# Patient Record
Sex: Male | Born: 1945 | Race: White | Hispanic: No | Marital: Married | Smoking: Never smoker
Health system: Southern US, Community
[De-identification: ages and names within clinical notes are randomized; demographics above are authoritative.]

## PROBLEM LIST (undated history)

## (undated) DIAGNOSIS — E119 Type 2 diabetes mellitus without complications: Secondary | ICD-10-CM

## (undated) DIAGNOSIS — N400 Enlarged prostate without lower urinary tract symptoms: Secondary | ICD-10-CM

## (undated) DIAGNOSIS — G4733 Obstructive sleep apnea (adult) (pediatric): Secondary | ICD-10-CM

## (undated) DIAGNOSIS — Z95810 Presence of automatic (implantable) cardiac defibrillator: Secondary | ICD-10-CM

## (undated) DIAGNOSIS — I5022 Chronic systolic (congestive) heart failure: Secondary | ICD-10-CM

## (undated) DIAGNOSIS — E669 Obesity, unspecified: Secondary | ICD-10-CM

## (undated) DIAGNOSIS — I10 Essential (primary) hypertension: Secondary | ICD-10-CM

## (undated) DIAGNOSIS — I2510 Atherosclerotic heart disease of native coronary artery without angina pectoris: Secondary | ICD-10-CM

## (undated) HISTORY — PX: PACEMAKER IMPLANT: EP1218

## (undated) HISTORY — PX: BYPASS GRAFT ANGIOGRAPHY: CATH118229

## (undated) HISTORY — PX: CHOLECYSTECTOMY: SHX55

## (undated) NOTE — Anesthesia Procedure Notes (Signed)
 Associated Order(s): Airway Formatting of this note might be different from the original. Airway Patient Location: OR Urgency: Elective Indications: Anesthesia Procedure Start Time: 02/19/2024 7:41 AM Performing Provider: Marinda Leonor Mace, CRNA Authorizing Provider: Alvis Charlie Pointer, MD  AIRWAY DETAILS  MILS Maintained?: MILS maintained throughout Airway type: ETT Mask Ventilation: Easy  Endotracheal Tube ETT Type: Oral Cuffed?: yes Placement technique: Direct laryngoscopy Facilitating devices/methods: Stylet Insertion site: Oral Blade Type: Miller Blade size: #2 ETT size: 7.5 mm  Placement verified by: auscultation and CO2 detection Cormack - Lehane Classification: Grade IIa - partial view of glottis Taped @: 22 cm Number of attempts: 1 Difficult Airway: airway not difficult Aspiration? no aspiration Lips and Teeth: intact Eyes: Taped  Additional Comments: Lidocaine  4% (LTA) 120mg     Electronically signed by Marinda Leonor Mace, CRNA at 02/19/2024  8:01 AM CDT

## (undated) NOTE — Anesthesia Preprocedure Evaluation (Signed)
 Formatting of this note is different from the original. Images from the original note were not included. Pre-Anesthesia Evaluation  Patient Height:   Patient Weight:   Surgeon: Kayla Massie Pierre, MD PONV Risk Factors: PONV: Low Risk  Total Score: 1   Score Rules    Non-smoker   Criteria that do not apply:   Male patient   History of PONV   History of motion sickness   Intended opioid administration    Allergies: Allergies[1] Number of Hours NPO: 11.3  Anesthesia History  No specialty history recorded   Other Medical History  Hypertension Hyperlipidemia  Myocardial infarction (HC Category) History of stroke  Atrial fibrillation (HC Category)    Surgical History  CORONARY ARTERY BYPASS GRAFT CARDIOVERSION  TOTAL HIP ARTHROPLASTY CHOLECYSTECTOMY  CARDIAC PACEMAKER PLACEMENT CARDIAC DEFIBRILLATOR PLACEMENT   Substance History  Smoking Status: Never  Smokeless Tobacco Status: Never  Alcohol use: Never  Drug use: Never   Anesthesia Family History  Problem Relations (Age of Onset)  No history of this type found     Relevant Problems  No relevant active problems   Anesthetic Review of Systems/Med History  Patient summary reviewed, Nursing notes reviewed - History of anesthetic complications  Pulmonary:  Other Pulmonary findings:  Tests reviewed: ECG and all labs  Beta Blockers:   Neuro/Psych:    + CVA -            Other Neuro/Psych Findings:  Cardiovascular:   + pacemaker, + hypercholesterolemia, + obesity: Obesity (BMI>30), + hypertension, + dysrhythmia: atrial fibrillation,   Other Cardiac Findings:   Endocrine/Other:         + GLP-1 agonist  Endo/Other Findings: Last [facility] Wednesday 02/14/2024 GI/Hepatic/Renal:  Other GI/Hepatic/Renal Findings:   OBGYN:   Other OBGYN Findings:  Pediatric:   Other Pediatric Findings:    Anesthesia Physical Exam: Airway: Mallampati Score: II   Cardiovascular:     Paced  Abdominal: + obesity  Pulmonary: pulmonary exam normal    Dental:  Caps and crowns.  Last Liquid Intake: >=2 hrs Last Solid Intake: >=8 hrs  Other Anesthesia Findings/Comments:   Anesthesia Plan  ASA Score: 3  Anesthetic plan:  general Regional block contraindicated:  Regional block contraindications:  Sedation type:  Additional comments:  Planned airway type: ETT Induction method: intravenous Anesthesia plan, risks and options discussed with: patient Use of blood products discussed with:  Patient:  Blood refused reason (if any):      [1] No Known Allergies  Electronically signed by Alvis Charlie Pointer, MD at 02/19/2024  6:28 AM CDT

## (undated) NOTE — Anesthesia Postprocedure Evaluation (Signed)
 Formatting of this note might be different from the original. Post-Operative Anesthesia  Patient: Hoby Kawai Anesthesia Type: general  BP: 129/73  SpO2: 96 %  Heart Rate: 60  Resp: 19  Temp: 37 C (98.6 F)  This assessment was performed by me at the conclusion of anesthesia care in the PACU.. The patient was evaluated and found to have returned to baseline cardiopulmonary and mental status.  Anesthesia complications encountered?: no anesthesia complication  Where was patient evaluated?: PACU  Post anesthesia evaluation reveals the following: Cardiovascular Function: acceptable and stable Hydration Status: acceptable Nausea/Vomiting Management: none Airway Patency: patent   Respiratory Status: acceptable Level of Consciousness: sleepy but conscious Patient participation in evaluation: complete - patient participated Pain Control Pain Score (1-10 scale): 0 Pain Management: adequate     Continued or Long-Acting Analgesia Issues: None required  Nathaniel J. Marinda, CRNA 02/19/2024 Electronically signed by Marinda Leonor Mace, CRNA at 02/19/2024  9:27 AM CDT

---

## 2017-08-31 DIAGNOSIS — G4733 Obstructive sleep apnea (adult) (pediatric): Secondary | ICD-10-CM | POA: Diagnosis present

## 2020-10-21 ENCOUNTER — Other Ambulatory Visit: Payer: Self-pay

## 2020-10-21 ENCOUNTER — Encounter (HOSPITAL_COMMUNITY): Payer: Self-pay

## 2020-10-21 ENCOUNTER — Emergency Department (HOSPITAL_COMMUNITY): Payer: Medicare Other

## 2020-10-21 ENCOUNTER — Inpatient Hospital Stay (HOSPITAL_COMMUNITY)
Admission: EM | Admit: 2020-10-21 | Discharge: 2020-10-27 | DRG: 291 | Disposition: A | Discharge status: Home / Self Care | Payer: Medicare Other | Attending: Internal Medicine | Admitting: Internal Medicine

## 2020-10-21 ENCOUNTER — Ambulatory Visit (HOSPITAL_COMMUNITY)
Admission: EM | Admit: 2020-10-21 | Discharge: 2020-10-21 | Disposition: A | Discharge status: Home / Self Care | Payer: Medicare Other

## 2020-10-21 DIAGNOSIS — Z79899 Other long term (current) drug therapy: Secondary | ICD-10-CM

## 2020-10-21 DIAGNOSIS — I420 Dilated cardiomyopathy: Secondary | ICD-10-CM | POA: Diagnosis present

## 2020-10-21 DIAGNOSIS — I2510 Atherosclerotic heart disease of native coronary artery without angina pectoris: Secondary | ICD-10-CM | POA: Diagnosis present

## 2020-10-21 DIAGNOSIS — E876 Hypokalemia: Secondary | ICD-10-CM | POA: Diagnosis present

## 2020-10-21 DIAGNOSIS — Z951 Presence of aortocoronary bypass graft: Secondary | ICD-10-CM

## 2020-10-21 DIAGNOSIS — I252 Old myocardial infarction: Secondary | ICD-10-CM

## 2020-10-21 DIAGNOSIS — Z7902 Long term (current) use of antithrombotics/antiplatelets: Secondary | ICD-10-CM

## 2020-10-21 DIAGNOSIS — I509 Heart failure, unspecified: Secondary | ICD-10-CM

## 2020-10-21 DIAGNOSIS — I5082 Biventricular heart failure: Secondary | ICD-10-CM | POA: Diagnosis present

## 2020-10-21 DIAGNOSIS — Z6834 Body mass index (BMI) 34.0-34.9, adult: Secondary | ICD-10-CM

## 2020-10-21 DIAGNOSIS — R609 Edema, unspecified: Secondary | ICD-10-CM | POA: Diagnosis not present

## 2020-10-21 DIAGNOSIS — Z8249 Family history of ischemic heart disease and other diseases of the circulatory system: Secondary | ICD-10-CM

## 2020-10-21 DIAGNOSIS — I270 Primary pulmonary hypertension: Secondary | ICD-10-CM | POA: Insufficient documentation

## 2020-10-21 DIAGNOSIS — Z9049 Acquired absence of other specified parts of digestive tract: Secondary | ICD-10-CM

## 2020-10-21 DIAGNOSIS — E785 Hyperlipidemia, unspecified: Secondary | ICD-10-CM | POA: Diagnosis present

## 2020-10-21 DIAGNOSIS — I255 Ischemic cardiomyopathy: Secondary | ICD-10-CM | POA: Diagnosis present

## 2020-10-21 DIAGNOSIS — R079 Chest pain, unspecified: Secondary | ICD-10-CM | POA: Diagnosis present

## 2020-10-21 DIAGNOSIS — Z7982 Long term (current) use of aspirin: Secondary | ICD-10-CM

## 2020-10-21 DIAGNOSIS — I110 Hypertensive heart disease with heart failure: Principal | ICD-10-CM | POA: Diagnosis present

## 2020-10-21 DIAGNOSIS — N400 Enlarged prostate without lower urinary tract symptoms: Secondary | ICD-10-CM | POA: Diagnosis present

## 2020-10-21 DIAGNOSIS — E119 Type 2 diabetes mellitus without complications: Secondary | ICD-10-CM | POA: Diagnosis present

## 2020-10-21 DIAGNOSIS — I5023 Acute on chronic systolic (congestive) heart failure: Secondary | ICD-10-CM | POA: Diagnosis not present

## 2020-10-21 DIAGNOSIS — I4892 Unspecified atrial flutter: Secondary | ICD-10-CM | POA: Diagnosis present

## 2020-10-21 DIAGNOSIS — Z20822 Contact with and (suspected) exposure to covid-19: Secondary | ICD-10-CM | POA: Diagnosis present

## 2020-10-21 DIAGNOSIS — Z28310 Unvaccinated for covid-19: Secondary | ICD-10-CM

## 2020-10-21 DIAGNOSIS — G4733 Obstructive sleep apnea (adult) (pediatric): Secondary | ICD-10-CM | POA: Diagnosis present

## 2020-10-21 DIAGNOSIS — Z955 Presence of coronary angioplasty implant and graft: Secondary | ICD-10-CM

## 2020-10-21 DIAGNOSIS — Z9111 Patient's noncompliance with dietary regimen: Secondary | ICD-10-CM

## 2020-10-21 DIAGNOSIS — E669 Obesity, unspecified: Secondary | ICD-10-CM | POA: Diagnosis present

## 2020-10-21 DIAGNOSIS — Z7984 Long term (current) use of oral hypoglycemic drugs: Secondary | ICD-10-CM

## 2020-10-21 DIAGNOSIS — I10 Essential (primary) hypertension: Secondary | ICD-10-CM | POA: Diagnosis present

## 2020-10-21 DIAGNOSIS — R110 Nausea: Secondary | ICD-10-CM

## 2020-10-21 DIAGNOSIS — Z95810 Presence of automatic (implantable) cardiac defibrillator: Secondary | ICD-10-CM

## 2020-10-21 HISTORY — DX: Essential (primary) hypertension: I10

## 2020-10-21 HISTORY — DX: Obesity, unspecified: E66.9

## 2020-10-21 HISTORY — DX: Benign prostatic hyperplasia without lower urinary tract symptoms: N40.0

## 2020-10-21 HISTORY — DX: Chronic systolic (congestive) heart failure: I50.22

## 2020-10-21 HISTORY — DX: Atherosclerotic heart disease of native coronary artery without angina pectoris: I25.10

## 2020-10-21 HISTORY — DX: Type 2 diabetes mellitus without complications: E11.9

## 2020-10-21 HISTORY — DX: Presence of automatic (implantable) cardiac defibrillator: Z95.810

## 2020-10-21 HISTORY — DX: Obstructive sleep apnea (adult) (pediatric): G47.33

## 2020-10-21 LAB — URINALYSIS, ROUTINE W REFLEX MICROSCOPIC
Bilirubin Urine: NEGATIVE
Glucose, UA: NEGATIVE mg/dL
Hgb urine dipstick: NEGATIVE
Ketones, ur: NEGATIVE mg/dL
Leukocytes,Ua: NEGATIVE
Nitrite: NEGATIVE
Protein, ur: NEGATIVE mg/dL
Specific Gravity, Urine: 1.006 (ref 1.005–1.030)
pH: 7 (ref 5.0–8.0)

## 2020-10-21 LAB — BRAIN NATRIURETIC PEPTIDE: B Natriuretic Peptide: 267.2 pg/mL — ABNORMAL HIGH (ref 0.0–100.0)

## 2020-10-21 LAB — LIPASE, BLOOD: Lipase: 45 U/L (ref 11–51)

## 2020-10-21 LAB — HEPATIC FUNCTION PANEL
ALT: 29 U/L (ref 0–44)
AST: 27 U/L (ref 15–41)
Albumin: 4 g/dL (ref 3.5–5.0)
Alkaline Phosphatase: 120 U/L (ref 38–126)
Bilirubin, Direct: 0.3 mg/dL — ABNORMAL HIGH (ref 0.0–0.2)
Indirect Bilirubin: 0.8 mg/dL (ref 0.3–0.9)
Total Bilirubin: 1.1 mg/dL (ref 0.3–1.2)
Total Protein: 7.2 g/dL (ref 6.5–8.1)

## 2020-10-21 LAB — BASIC METABOLIC PANEL
Anion gap: 10 (ref 5–15)
BUN: 18 mg/dL (ref 8–23)
CO2: 27 mmol/L (ref 22–32)
Calcium: 9.6 mg/dL (ref 8.9–10.3)
Chloride: 102 mmol/L (ref 98–111)
Creatinine, Ser: 0.91 mg/dL (ref 0.61–1.24)
GFR, Estimated: >60 mL/min (ref >60)
Glucose, Bld: 111 mg/dL — ABNORMAL HIGH (ref 70–99)
Potassium: 3.6 mmol/L (ref 3.5–5.1)
Sodium: 139 mmol/L (ref 135–145)

## 2020-10-21 LAB — CBC
HCT: 42.9 % (ref 39.0–52.0)
Hemoglobin: 13.3 g/dL (ref 13.0–17.0)
MCH: 27.2 pg (ref 26.0–34.0)
MCHC: 31 g/dL (ref 30.0–36.0)
MCV: 87.7 fL (ref 80.0–100.0)
Platelets: 149 10*3/uL — ABNORMAL LOW (ref 150–400)
RBC: 4.89 MIL/uL (ref 4.22–5.81)
RDW: 17.2 % — ABNORMAL HIGH (ref 11.5–15.5)
WBC: 5.3 10*3/uL (ref 4.0–10.5)
nRBC: 0 % (ref 0.0–0.2)

## 2020-10-21 LAB — TROPONIN I (HIGH SENSITIVITY)
Troponin I (High Sensitivity): 15 ng/L (ref <18)
Troponin I (High Sensitivity): 16 ng/L (ref <18)

## 2020-10-21 LAB — GLUCOSE, CAPILLARY: Glucose-Capillary: 121 mg/dL — ABNORMAL HIGH (ref 70–99)

## 2020-10-21 LAB — RESP PANEL BY RT-PCR (FLU A&B, COVID) ARPGX2
Influenza A by PCR: NEGATIVE
Influenza B by PCR: NEGATIVE
SARS Coronavirus 2 by RT PCR: NEGATIVE

## 2020-10-21 MED ORDER — LISINOPRIL 10 MG PO TABS
10.0000 mg | ORAL_TABLET | Freq: Every day | ORAL | Status: DC
  Administered 2020-10-22: 10 mg via ORAL
  Filled 2020-10-21: qty 1

## 2020-10-21 MED ORDER — FUROSEMIDE 40 MG PO TABS
40.0000 mg | ORAL_TABLET | Freq: Every day | ORAL | Status: DC
  Administered 2020-10-22: 40 mg via ORAL
  Filled 2020-10-21: qty 1

## 2020-10-21 MED ORDER — ASPIRIN EC 81 MG PO TBEC
81.0000 mg | DELAYED_RELEASE_TABLET | Freq: Every day | ORAL | Status: DC
  Administered 2020-10-22 – 2020-10-25 (×4): 81 mg via ORAL
  Filled 2020-10-21 (×4): qty 1

## 2020-10-21 MED ORDER — CARVEDILOL 25 MG PO TABS
25.0000 mg | ORAL_TABLET | Freq: Two times a day (BID) | ORAL | Status: DC
  Administered 2020-10-21 – 2020-10-27 (×12): 25 mg via ORAL
  Filled 2020-10-21 (×12): qty 1

## 2020-10-21 MED ORDER — SODIUM CHLORIDE 0.9% FLUSH
3.0000 mL | Freq: Two times a day (BID) | INTRAVENOUS | Status: DC
  Administered 2020-10-21 – 2020-10-27 (×12): 3 mL via INTRAVENOUS

## 2020-10-21 MED ORDER — FINASTERIDE 5 MG PO TABS: 5.0000 mg | ORAL_TABLET | Freq: Every day | ORAL | Status: DC

## 2020-10-21 MED ORDER — CLOPIDOGREL BISULFATE 75 MG PO TABS
75.0000 mg | ORAL_TABLET | Freq: Every day | ORAL | Status: DC
  Administered 2020-10-22 – 2020-10-27 (×6): 75 mg via ORAL
  Filled 2020-10-21 (×6): qty 1

## 2020-10-21 MED ORDER — ACETAMINOPHEN 650 MG RE SUPP
650.0000 mg | Freq: Four times a day (QID) | RECTAL | Status: DC | PRN
Start: 2020-10-21 — End: 2020-10-27

## 2020-10-21 MED ORDER — TAMSULOSIN HCL 0.4 MG PO CAPS
0.4000 mg | ORAL_CAPSULE | Freq: Every day | ORAL | Status: DC
  Administered 2020-10-22 – 2020-10-27 (×6): 0.4 mg via ORAL
  Filled 2020-10-21 (×6): qty 1

## 2020-10-21 MED ORDER — ENOXAPARIN SODIUM 40 MG/0.4ML IJ SOSY
40.0000 mg | PREFILLED_SYRINGE | Freq: Every day | INTRAMUSCULAR | Status: DC
  Administered 2020-10-22 – 2020-10-25 (×2): 40 mg via SUBCUTANEOUS
  Filled 2020-10-21 (×4): qty 0.4

## 2020-10-21 MED ORDER — FUROSEMIDE 10 MG/ML IJ SOLN
20.0000 mg | Freq: Once | INTRAMUSCULAR | Status: AC
  Administered 2020-10-21: 20 mg via INTRAVENOUS
  Filled 2020-10-21: qty 2

## 2020-10-21 MED ORDER — POLYETHYLENE GLYCOL 3350 17 G PO PACK
17.0000 g | PACK | Freq: Every day | ORAL | Status: DC | PRN
  Administered 2020-10-25 – 2020-10-26 (×2): 17 g via ORAL
  Filled 2020-10-21 (×2): qty 1

## 2020-10-21 MED ORDER — FINASTERIDE 5 MG PO TABS
5.0000 mg | ORAL_TABLET | Freq: Every day | ORAL | Status: DC
  Administered 2020-10-21 – 2020-10-26 (×6): 5 mg via ORAL
  Filled 2020-10-21 (×6): qty 1

## 2020-10-21 MED ORDER — INSULIN ASPART 100 UNIT/ML IJ SOLN
0.0000 [IU] | Freq: Three times a day (TID) | INTRAMUSCULAR | Status: DC
  Administered 2020-10-22 – 2020-10-23 (×2): 3 [IU] via SUBCUTANEOUS
  Administered 2020-10-23 – 2020-10-24 (×3): 2 [IU] via SUBCUTANEOUS

## 2020-10-21 MED ORDER — ACETAMINOPHEN 325 MG PO TABS: 650.0000 mg | ORAL_TABLET | Freq: Four times a day (QID) | ORAL | Status: DC | PRN

## 2020-10-21 MED ORDER — CARVEDILOL 12.5 MG PO TABS: 25.0000 mg | ORAL_TABLET | Freq: Two times a day (BID) | ORAL | Status: DC

## 2020-10-21 MED ORDER — ROSUVASTATIN CALCIUM 20 MG PO TABS: 40.0000 mg | ORAL_TABLET | Freq: Every day | ORAL | Status: DC

## 2020-10-21 MED ORDER — FUROSEMIDE 20 MG PO TABS
20.0000 mg | ORAL_TABLET | Freq: Every day | ORAL | Status: DC
Start: 2020-10-22 — End: 2020-10-21

## 2020-10-21 MED ORDER — NITROGLYCERIN 0.4 MG SL SUBL: 0.4000 mg | SUBLINGUAL_TABLET | SUBLINGUAL | Status: DC | PRN

## 2020-10-21 MED ORDER — ROSUVASTATIN CALCIUM 20 MG PO TABS
40.0000 mg | ORAL_TABLET | Freq: Every day | ORAL | Status: DC
  Administered 2020-10-21 – 2020-10-26 (×6): 40 mg via ORAL
  Filled 2020-10-21 (×7): qty 2

## 2020-10-21 NOTE — ED Provider Notes (Signed)
[facility]    CSN: [id number] Arrival date & time: 10/21/20  1040      History   Chief Complaint Chief Complaint  Patient presents with   Nausea   Leg Swelling    HPI Chad Hahn is a 75 y.o. male.   HPI  Leg Swelling: Patient reports that he had an emergency cholecystectomy about 2 months ago.  He states that after the surgery he was "given too much fluid "and he went into the hospital with heart failure soon after.  He states that progressively since that time he has been feeling worse.  He has had nausea, extension of his belly, peripheral edema, shortness of breath with exertion and yesterday he did have some chest tightness.  He states that he is not tracking his weight.  No active shortness of breath or chest pain.  No fevers.  Past Medical History:  Diagnosis Date   CHF (congestive heart failure) (HCC)    Diabetes mellitus without complication (HCC)    Hypertension    Pacemaker     There are no problems to display for this patient.   Past Surgical History:  Procedure Laterality Date   BYPASS GRAFT ANGIOGRAPHY     CHOLECYSTECTOMY     PACEMAKER IMPLANT         Home Medications    Prior to Admission medications   Medication Sig Start Date End Date Taking? Authorizing Provider  tamsulosin (FLOMAX) 0.4 MG CAPS capsule Take 0.4 mg by mouth.   Yes [provider]  aspirin 81 MG EC tablet Adult Low Dose Aspirin 81 mg tablet,delayed release  Take 1 tablet every day by oral route.    [provider]  carvedilol (COREG) 25 MG tablet carvedilol 25 mg tablet    [provider]  clopidogrel (PLAVIX) 75 MG tablet clopidogrel 75 mg tablet    [provider]  finasteride (PROSCAR) 5 MG tablet finasteride 5 mg tablet    [provider]  furosemide (LASIX) 20 MG tablet furosemide 20 mg tablet    [provider]  lisinopril (ZESTRIL) 10 MG tablet lisinopril 10 mg tablet    [provider]   metFORMIN (GLUCOPHAGE-XR) 500 MG 24 hr tablet metformin ER 500 mg tablet,extended release 24 hr    [provider]  nitroGLYCERIN (NITROSTAT) 0.4 MG SL tablet nitroglycerin 0.4 mg sublingual tablet    [provider]  rosuvastatin (CRESTOR) 40 MG tablet rosuvastatin 40 mg tablet    [provider]    Family History History reviewed. No pertinent family history.  Social History Social History   Tobacco Use   Smoking status: Never   Smokeless tobacco: Never  Vaping Use   Vaping Use: Never used  Substance Use Topics   Alcohol use: Not Currently   Drug use: Never     Allergies   Patient has no known allergies.   Review of Systems Review of Systems  As stated above in HPI Physical Exam Triage Vital Signs ED Triage Vitals  Enc Vitals Group     BP 10/21/20 1114 131/78     Pulse Rate 10/21/20 1114 66     Resp 10/21/20 1114 20     Temp 10/21/20 1114 98.4 F (36.9 C)     Temp Source 10/21/20 1114 Oral     SpO2 10/21/20 1114 97 %     Weight --      Height --      Head Circumference --  Peak Flow --      Pain Score 10/21/20 1117 0     Pain Loc --      Pain Edu? --      Excl. in GC? --    No data found.  Updated Vital Signs BP 131/78 (BP Location: Right Arm)   Pulse 66   Temp 98.4 F (36.9 C) (Oral)   Resp 20   SpO2 97%   Physical Exam Vitals and nursing note reviewed.  Constitutional:      General: He is not in acute distress.    Appearance: Normal appearance. He is not ill-appearing, toxic-appearing or diaphoretic.  HENT:     Head: Normocephalic and atraumatic.  Cardiovascular:     Rate and Rhythm: Normal rate and regular rhythm.     Heart sounds: Normal heart sounds.  Pulmonary:     Effort: Pulmonary effort is normal.     Breath sounds: Rhonchi and rales present.  Abdominal:     General: There is distension.  Musculoskeletal:     Right lower leg: Edema present.     Left lower leg: Edema present.  Skin:    General:  Skin is warm.     Coloration: Skin is not jaundiced or pale.  Neurological:     Mental Status: He is alert and oriented to person, place, and time.     UC Treatments / Results  Labs (all labs ordered are listed, but only abnormal results are displayed) Labs Reviewed - No data to display  EKG   Radiology No results found.  Procedures Procedures (including critical care time)  Medications Ordered in UC Medications - No data to display  Initial Impression / Assessment and Plan / UC Course  I have reviewed the triage vital signs and the nursing notes.  Pertinent labs & imaging results that were available during my care of the patient were reviewed by me and considered in my medical decision making (see chart for details).     New.  I am concerned that he has acute on chronic heart failure.  I discussed this with patient.  I have recommended that he go to the emergency room for further evaluation.  He is going to have his daughter transport him to the hospital which is his request. Final Clinical Impressions(s) / UC Diagnoses   Final diagnoses:  None   Discharge Instructions   None    ED Prescriptions   None    PDMP not reviewed this encounter.   Rushie Chestnut, [place] 10/21/20 1151

## 2020-10-21 NOTE — Discharge Instructions (Addendum)
Please go directly to the hospital  °

## 2020-10-21 NOTE — ED Provider Notes (Signed)
Emergency Medicine Provider Triage Evaluation Note  Chad Hahn , a 75 y.o. male  was evaluated in triage.  Pt complains of edema, chest pressure, nausea and fatigue. He states that about 2 months ago he had his gallbladder removed in [place].  He states that he had significant complications with that including that it had burst and he was given a lot of fluid.  After that he was readmitted a few days later for edema. He states that he is now here in [place] as his daughter lives here.  His primary medical team is in [place].  He has been taking Lasix.  He states that his swelling is gotten worse over the past 3 weeks..  Review of Systems  Positive: Leg swelling, fatigue  Negative: Fevers, vomiting, diarrhea  Physical Exam  BP 136/81 (BP Location: Right Arm)   Pulse 70   Temp 98 F (36.7 C) (Oral)   Resp 17   SpO2 97%  Gen:   Awake, no distress   Resp:  Normal effort  MSK:   Moves extremities without difficulty  Other:  3+ pitting edema bilateral lower extremities.   Medical Decision Making  Medically screening exam initiated at 12:51 PM.  Appropriate orders placed.  Chad Hahn was informed that the remainder of the evaluation will be completed by another provider, this initial triage assessment does not replace that evaluation, and the importance of remaining in the ED until their evaluation is complete.  Note: Portions of this report may have been transcribed using voice recognition software. Every effort was made to ensure accuracy; however, inadvertent computerized transcription errors may be present    Chad Hahn 10/21/20 1252    Pricilla Loveless, MD 10/24/20 1520

## 2020-10-21 NOTE — H&P (Signed)
History and Physical   Chad LaundryRand Mitro ZOX:[id number] DOB: 06/30/1945 DOA: 10/21/2020  PCP: System, Provider Not In   Patient coming from: Home  Chief Complaint: Chest pain, edema, shortness of breath  HPI: Chad Hahn is a 75 y.o. male with medical history significant of CHF, hypertension, CAD status post CABG, diabetes, BPH, OSA status post pacemaker.  He presents with ongoing intermittent edema, chest pressure, shortness of breath, fatigue. Patient previously lived in [place] and works as an Art gallery managerengineer.  2 months ago he was in [place] for work and ended up having to have his gallbladder removed.  Around that hospitalization he had exacerbation of chronic CHF. Reportedly his EF was in the 30s to 40s however he do not have documentation of this. He has come to [place] to stay with his daughter.  We do not have significant records as above he is from [place].  He states that he has had intermittent edema, chest pressure, shortness of breath worse with exertion for the last few weeks.  He has orthopnea.  He has been taking his Lasix 40 mg daily.  He has had chest pain described as chest pressure, none currently. He denies fevers, chills, abdominal pain, constipation, diarrhea, nausea, vomiting.  Daughter is former cardiac Nurse and has some concerns that he hasn't been himself since hospitalization 2 months ago and wants to make sure his heart is okay.  ED Course: Vital signs in the ED were stable.  Lab work-up showed CMP with glucose 111, CBC with platelets 149.  Troponin normal x2, BNP mildly elevated to 67.  Lipase negative.  Respiratory panel flu and COVID-negative.  Chest x-ray with cardiomegaly but no significant signs of acute CHF.  Urinalysis normal.  Patient was given a dose of IV Lasix in ED.  Review of Systems: As per HPI otherwise all other systems reviewed and are negative.  Past Medical History:  Diagnosis Date   CHF (congestive heart failure) (HCC)    Diabetes mellitus  without complication (HCC)    Hypertension    Pacemaker     Past Surgical History:  Procedure Laterality Date   BYPASS GRAFT ANGIOGRAPHY     CHOLECYSTECTOMY     PACEMAKER IMPLANT      Social History  reports that he has never smoked. He has never used smokeless tobacco. He reports previous alcohol use. He reports that he does not use drugs.  No Known Allergies  No family history on file.  Prior to Admission medications   Medication Sig Start Date End Date Taking? Authorizing Provider  aspirin 81 MG EC tablet Adult Low Dose Aspirin 81 mg tablet,delayed release  Take 1 tablet every day by oral route.    [provider]  carvedilol (COREG) 25 MG tablet carvedilol 25 mg tablet    [provider]  clopidogrel (PLAVIX) 75 MG tablet clopidogrel 75 mg tablet    [provider]  finasteride (PROSCAR) 5 MG tablet finasteride 5 mg tablet    [provider]  furosemide (LASIX) 20 MG tablet furosemide 20 mg tablet    [provider]  lisinopril (ZESTRIL) 10 MG tablet lisinopril 10 mg tablet    [provider]  metFORMIN (GLUCOPHAGE-XR) 500 MG 24 hr tablet metformin ER 500 mg tablet,extended release 24 hr    [provider]  nitroGLYCERIN (NITROSTAT) 0.4 MG SL tablet nitroglycerin 0.4 mg sublingual tablet    [provider]  rosuvastatin (CRESTOR) 40 MG tablet rosuvastatin 40 mg tablet    [provider]  tamsulosin (FLOMAX) 0.4 MG CAPS capsule Take 0.4 mg by mouth.    [provider]    Physical Exam: Vitals:   10/21/20 1700 10/21/20 1759 10/21/20 1830 10/21/20 2000  BP: (!) 146/87 (!) 143/93 (!) 148/84 (!) 145/94  Pulse: 70  70 71  Resp: 16 20 (!) 22 20  Temp:      TempSrc:      SpO2: 98% 100% 100% 100%   Physical Exam Constitutional:      General: He is not in acute distress.    Appearance: Normal appearance.  HENT:     Head: Normocephalic and atraumatic.     Mouth/Throat:     Mouth:  Mucous membranes are moist.     Pharynx: Oropharynx is clear.  Eyes:     Extraocular Movements: Extraocular movements intact.     Pupils: Pupils are equal, round, and reactive to light.  Cardiovascular:     Rate and Rhythm: Normal rate and regular rhythm.     Pulses: Normal pulses.     Heart sounds: Normal heart sounds.  Pulmonary:     Effort: Pulmonary effort is normal. No respiratory distress.     Breath sounds: Normal breath sounds.  Abdominal:     General: Bowel sounds are normal. There is no distension.     Palpations: Abdomen is soft.     Tenderness: There is no abdominal tenderness.  Musculoskeletal:        General: No swelling or deformity.     Right lower leg: Edema present.     Left lower leg: Edema present.  Skin:    General: Skin is warm and dry.  Neurological:     General: No focal deficit present.     Mental Status: Mental status is at baseline.    Labs on Admission: I have personally reviewed following labs and imaging studies  CBC: Recent Labs  Lab 10/21/20 1240  WBC 5.3  HGB 13.3  HCT 42.9  MCV 87.7  PLT 149*    Basic Metabolic Panel: Recent Labs  Lab 10/21/20 1240  NA 139  K 3.6  CL 102  CO2 27  GLUCOSE 111*  BUN 18  CREATININE 0.91  CALCIUM 9.6    GFR: CrCl cannot be calculated (Unknown ideal weight.).  Liver Function Tests: Recent Labs  Lab 10/21/20 1240  AST 27  ALT 29  ALKPHOS 120  BILITOT 1.1  PROT 7.2  ALBUMIN 4.0    Urine analysis:    Component Value Date/Time   COLORURINE STRAW (A) 10/21/2020 1251   APPEARANCEUR CLEAR 10/21/2020 1251   LABSPEC 1.006 10/21/2020 1251   PHURINE 7.0 10/21/2020 1251   GLUCOSEU NEGATIVE 10/21/2020 1251   HGBUR NEGATIVE 10/21/2020 1251   BILIRUBINUR NEGATIVE 10/21/2020 1251   KETONESUR NEGATIVE 10/21/2020 1251   PROTEINUR NEGATIVE 10/21/2020 1251   NITRITE NEGATIVE 10/21/2020 1251   LEUKOCYTESUR NEGATIVE 10/21/2020 1251    Radiological Exams on Admission: DG Chest 2  View  Result Date: 10/21/2020 CLINICAL DATA:  Chest pain EXAM: CHEST - 2 VIEW COMPARISON:  None. FINDINGS: Left chest wall pacer device is noted with leads in the right atrial appendage, coronary sinus and right ventricle. Status post median sternotomy and CABG. Mild cardiac enlargement. No pleural effusion or edema. The visualized skeletal structures are unremarkable. IMPRESSION: No acute cardiopulmonary abnormalities. Cardiac enlargement without signs of CHF. Electronically Signed   By: Signa Kell M.D.   On: 10/21/2020 13:32    EKG: Independently reviewed. Ventricular  paced rhythm at 70 BPM.  Assessment/Plan Active Problems:   Essential hypertension   Ischemic congestive cardiomyopathy (HCC)   Obstructive sleep apnea syndrome   Chest pain, rule out acute myocardial infarction   Acute on chronic systolic CHF (congestive heart failure) (HCC)   Diabetes (HCC)   CAD (coronary artery disease)  Acute on chronic systolic CHF Chest pain/pressure.  Rule out ACS. > Patient presenting with intermittent chest pain/pressure worse with exertion along with some shortness of breath. > Also with some edema.  Troponin flat in ED.  BNP elevated to 267. > Chronic CHF with flair around gallbladder surgery in [place] 2 months ago.  At the time he reported his ED within the 35 to 45% range however we do not have records. > Patient has history of significant CAD with multiple stents and CABG and is new to the area without any providers.  Patient will be observed overnight due to his chest pressure mild CHF exacerbation and high risk history without outpatient providers. - Monitor on telemetry - Check response to dose of IV Lasix in ED, for now we will order 40 mg p.o. Lasix in the morning - Repeat echocardiogram - Obtain repeat troponins and EKG if further chest pain - Patient will likely benefit from cardiology consult in the morning to at least set him up with outpatient providers.  Consult placed to  care management as well  CAD Hypertension > History of CABG and multiple stents, no records available at this time. - Continue home rosuvastatin, Coreg, Plavix, aspirin, lisinopril  OSA - CPAP  Diabetes > On metformin at home - SSI   DVT prophylaxis: Lovenox  Code Status:   Full, states he will discuss further with family.  Family Communication:  Daughter U[dated by phone Disposition Plan:   Patient is from:  Home  Anticipated DC to:  Home  Anticipated DC date:  1-2 days  Anticipated DC barriers: None  Consults called:  None, may benefit from cardiology in the AM Admission status:  Observation, telemetry   Severity of Illness: The appropriate patient status for this patient is OBSERVATION. Observation status is judged to be reasonable and necessary in order to provide the required intensity of service to ensure the patient's safety. The patient's presenting symptoms, physical exam findings, and initial radiographic and laboratory data in the context of their medical condition is felt to place them at decreased risk for further clinical deterioration. Furthermore, it is anticipated that the patient will be medically stable for discharge from the hospital within 2 midnights of admission. The following factors support the patient status of observation.   " The patient's presenting symptoms include chest pressure, shortness of breath, edema. " The physical exam findings include edema. " The initial radiographic and laboratory data are BNP 267, platelets 1.9, troponin normal, lipase normal, respiratory negative for flu and COVID.Marland Kitchen   Synetta Fail MD [facility]  How to contact the [facility] Attending or Consulting provider 7A - 7P or covering provider during after hours 7P -7A, for this patient?   Check the care team in [facility] and look for a) attending/consulting [facility] provider listed and b) the [facility] team listed Log into [email or web address] and use [place]'s universal password to  access. If you do not have the password, please contact the hospital operator. Locate the [facility] provider you are looking for under [facility] and page to a number that you can be directly reached. If you still have difficulty reaching the provider, please page  the [facility] (Director on Call) for the Hospitalists listed on amion for assistance.  10/21/2020, 9:27 PM

## 2020-10-21 NOTE — ED Provider Notes (Signed)
[facility] EMERGENCY DEPARTMENT Provider Note   CSN: [id number] Arrival date & time: 10/21/20  1213     History Chief Complaint  Patient presents with  . Nausea  . Chest Pain    Chad Hahn is a 75 y.o. male.  The history is provided by the patient. No language interpreter was used.  Chest Pain Pain location:  Unable to specify Pain quality: aching   Pain radiates to:  Does not radiate Pain severity:  Mild Onset quality:  Gradual Duration:  1 day Timing:  Constant Progression:  Unable to specify Chronicity:  New Relieved by:  Nothing Worsened by:  Nothing Ineffective treatments:  None tried Risk factors: coronary artery disease and hypertension     Pt reports he has a history of CAD.  Pt reports he has had stents and bypass surgery.  Pt reports 2 months ago he had his gallbladder removed.  Pt states after surgery he had an episode of CHF.  Pt is on home lasix.  Pt reports his legs began swelling 3 days ago and are not going down with home lasix.  Pt reports his last EF was between 35-40.  Pt has no local doctors.  He is in the process of moving here from [place].  Pt reports recent shortness of breath with exertion.  Pt reports chest discomfort and nausea.    Past Medical History:  Diagnosis Date  . CHF (congestive heart failure) (HCC)   . Diabetes mellitus without complication (HCC)   . Hypertension   . Pacemaker     There are no problems to display for this patient.   Past Surgical History:  Procedure Laterality Date  . BYPASS GRAFT ANGIOGRAPHY    . CHOLECYSTECTOMY    . PACEMAKER IMPLANT         No family history on file.  Social History   Tobacco Use  . Smoking status: Never  . Smokeless tobacco: Never  Vaping Use  . Vaping Use: Never used  Substance Use Topics  . Alcohol use: Not Currently  . Drug use: Never    Home Medications Prior to Admission medications   Medication Sig Start Date End Date Taking? Authorizing  Provider  aspirin 81 MG EC tablet Adult Low Dose Aspirin 81 mg tablet,delayed release  Take 1 tablet every day by oral route.    [provider]  carvedilol (COREG) 25 MG tablet carvedilol 25 mg tablet    [provider]  clopidogrel (PLAVIX) 75 MG tablet clopidogrel 75 mg tablet    [provider]  finasteride (PROSCAR) 5 MG tablet finasteride 5 mg tablet    [provider]  furosemide (LASIX) 20 MG tablet furosemide 20 mg tablet    [provider]  lisinopril (ZESTRIL) 10 MG tablet lisinopril 10 mg tablet    [provider]  metFORMIN (GLUCOPHAGE-XR) 500 MG 24 hr tablet metformin ER 500 mg tablet,extended release 24 hr    [provider]  nitroGLYCERIN (NITROSTAT) 0.4 MG SL tablet nitroglycerin 0.4 mg sublingual tablet    [provider]  rosuvastatin (CRESTOR) 40 MG tablet rosuvastatin 40 mg tablet    [provider]  tamsulosin (FLOMAX) 0.4 MG CAPS capsule Take 0.4 mg by mouth.    [provider]    Allergies    Patient has no known allergies.  Review of Systems   Review of Systems  Cardiovascular:  Positive for chest pain.  All other systems reviewed and are negative.  Physical  Exam Updated Vital Signs BP (!) 146/87   Pulse 70   Temp (!) 97.2 F (36.2 C) (Oral)   Resp 16   SpO2 98%   Physical Exam Vitals and nursing note reviewed.  Constitutional:      Appearance: He is well-developed.  HENT:     Head: Normocephalic.  Cardiovascular:     Rate and Rhythm: Normal rate and regular rhythm.     Heart sounds: Normal heart sounds.  Pulmonary:     Effort: Pulmonary effort is normal.     Breath sounds: Normal breath sounds.  Abdominal:     General: There is no distension.     Palpations: Abdomen is soft.  Musculoskeletal:        General: Normal range of motion.     Cervical back: Normal range of motion.  Skin:    General: Skin is warm.  Neurological:     General: No focal deficit  present.     Mental Status: He is alert and oriented to person, place, and time.    ED Results / Procedures / Treatments   Labs (all labs ordered are listed, but only abnormal results are displayed) Labs Reviewed  BASIC METABOLIC PANEL - Abnormal; Notable for the following components:      Result Value   Glucose, Bld 111 (*)    All other components within normal limits  CBC - Abnormal; Notable for the following components:   RDW 17.2 (*)    Platelets 149 (*)    All other components within normal limits  BRAIN NATRIURETIC PEPTIDE - Abnormal; Notable for the following components:   B Natriuretic Peptide 267.2 (*)    All other components within normal limits  URINALYSIS, ROUTINE W REFLEX MICROSCOPIC - Abnormal; Notable for the following components:   Color, Urine STRAW (*)    All other components within normal limits  HEPATIC FUNCTION PANEL - Abnormal; Notable for the following components:   Bilirubin, Direct 0.3 (*)    All other components within normal limits  RESP PANEL BY RT-PCR (FLU A&B, COVID) ARPGX2  LIPASE, BLOOD  TROPONIN I (HIGH SENSITIVITY)  TROPONIN I (HIGH SENSITIVITY)    EKG EKG Interpretation  Date/Time:  Wednesday October 21 2020 12:28:48 EDT Ventricular Rate:  70 PR Interval:    QRS Duration: 176 QT Interval:  502 QTC Calculation: 542 R Axis:   157 Text Interpretation: Ventricular-paced rhythm Abnormal ECG Confirmed by Kristine Royal [phone]) on 10/21/2020 3:42:59 PM  Radiology DG Chest 2 View  Result Date: 10/21/2020 CLINICAL DATA:  Chest pain EXAM: CHEST - 2 VIEW COMPARISON:  None. FINDINGS: Left chest wall pacer device is noted with leads in the right atrial appendage, coronary sinus and right ventricle. Status post median sternotomy and CABG. Mild cardiac enlargement. No pleural effusion or edema. The visualized skeletal structures are unremarkable. IMPRESSION: No acute cardiopulmonary abnormalities. Cardiac enlargement without signs of CHF. Electronically  Signed   By: Signa Kell M.D.   On: 10/21/2020 13:32    Procedures Procedures   Medications Ordered in ED Medications  furosemide (LASIX) injection 20 mg (20 mg Intravenous Given 10/21/20 1721)    ED Course  I have reviewed the triage vital signs and the nursing notes.  Pertinent labs & imaging results that were available during my care of the patient were reviewed by me and considered in my medical decision making (see chart for details).    MDM Rules/Calculators/A&P  MDM:  BNP is elevated.  Ekg  n  acute  troponin negative x 2.   I spoke with Hospitalist who will admit for further testing Final Clinical Impression(s) / ED Diagnoses Final diagnoses:  None    Rx / DC Orders ED Discharge Orders     None        Osie Cheeks 10/21/20 2312    Wynetta Fines, MD 10/22/20 2257

## 2020-10-21 NOTE — ED Triage Notes (Signed)
Pt reports shortness of breath on exertion x 2 weeks; bilateral leg swelling x 3 week.   Pt reports he had dull chest pain yesterday, improved after taking nitroglycerin.   Pt reports he had 2 by pass, 4 stents and pacemaker.   Nausea after eating for the past 2 months. Reports he had his gallbladder removed 2 months ago.

## 2020-10-21 NOTE — ED Triage Notes (Signed)
Pt presents with nausea, discomfort to his abd and chest pressure x1 3 weeks after having his gall bladder removed

## 2020-10-22 ENCOUNTER — Observation Stay (HOSPITAL_COMMUNITY): Payer: Medicare Other

## 2020-10-22 DIAGNOSIS — Z7984 Long term (current) use of oral hypoglycemic drugs: Secondary | ICD-10-CM | POA: Diagnosis not present

## 2020-10-22 DIAGNOSIS — E876 Hypokalemia: Secondary | ICD-10-CM | POA: Diagnosis present

## 2020-10-22 DIAGNOSIS — Z28310 Unvaccinated for covid-19: Secondary | ICD-10-CM | POA: Diagnosis not present

## 2020-10-22 DIAGNOSIS — R079 Chest pain, unspecified: Secondary | ICD-10-CM | POA: Diagnosis present

## 2020-10-22 DIAGNOSIS — Z955 Presence of coronary angioplasty implant and graft: Secondary | ICD-10-CM | POA: Diagnosis not present

## 2020-10-22 DIAGNOSIS — I420 Dilated cardiomyopathy: Secondary | ICD-10-CM | POA: Diagnosis present

## 2020-10-22 DIAGNOSIS — E669 Obesity, unspecified: Secondary | ICD-10-CM | POA: Diagnosis present

## 2020-10-22 DIAGNOSIS — E785 Hyperlipidemia, unspecified: Secondary | ICD-10-CM | POA: Diagnosis present

## 2020-10-22 DIAGNOSIS — I509 Heart failure, unspecified: Secondary | ICD-10-CM

## 2020-10-22 DIAGNOSIS — I4892 Unspecified atrial flutter: Secondary | ICD-10-CM | POA: Diagnosis present

## 2020-10-22 DIAGNOSIS — I252 Old myocardial infarction: Secondary | ICD-10-CM | POA: Diagnosis not present

## 2020-10-22 DIAGNOSIS — Z9049 Acquired absence of other specified parts of digestive tract: Secondary | ICD-10-CM | POA: Diagnosis not present

## 2020-10-22 DIAGNOSIS — I5023 Acute on chronic systolic (congestive) heart failure: Secondary | ICD-10-CM | POA: Diagnosis present

## 2020-10-22 DIAGNOSIS — G4733 Obstructive sleep apnea (adult) (pediatric): Secondary | ICD-10-CM | POA: Diagnosis present

## 2020-10-22 DIAGNOSIS — I5082 Biventricular heart failure: Secondary | ICD-10-CM | POA: Diagnosis present

## 2020-10-22 DIAGNOSIS — I5043 Acute on chronic combined systolic (congestive) and diastolic (congestive) heart failure: Secondary | ICD-10-CM | POA: Diagnosis not present

## 2020-10-22 DIAGNOSIS — I10 Essential (primary) hypertension: Secondary | ICD-10-CM | POA: Diagnosis not present

## 2020-10-22 DIAGNOSIS — Z7902 Long term (current) use of antithrombotics/antiplatelets: Secondary | ICD-10-CM | POA: Diagnosis not present

## 2020-10-22 DIAGNOSIS — Z95810 Presence of automatic (implantable) cardiac defibrillator: Secondary | ICD-10-CM | POA: Diagnosis not present

## 2020-10-22 DIAGNOSIS — I110 Hypertensive heart disease with heart failure: Secondary | ICD-10-CM | POA: Diagnosis present

## 2020-10-22 DIAGNOSIS — I2510 Atherosclerotic heart disease of native coronary artery without angina pectoris: Secondary | ICD-10-CM | POA: Diagnosis present

## 2020-10-22 DIAGNOSIS — I255 Ischemic cardiomyopathy: Secondary | ICD-10-CM | POA: Diagnosis present

## 2020-10-22 DIAGNOSIS — Z20822 Contact with and (suspected) exposure to covid-19: Secondary | ICD-10-CM | POA: Diagnosis present

## 2020-10-22 DIAGNOSIS — Z951 Presence of aortocoronary bypass graft: Secondary | ICD-10-CM | POA: Diagnosis not present

## 2020-10-22 DIAGNOSIS — Z6834 Body mass index (BMI) 34.0-34.9, adult: Secondary | ICD-10-CM | POA: Diagnosis not present

## 2020-10-22 DIAGNOSIS — E119 Type 2 diabetes mellitus without complications: Secondary | ICD-10-CM | POA: Diagnosis present

## 2020-10-22 DIAGNOSIS — Z9111 Patient's noncompliance with dietary regimen: Secondary | ICD-10-CM | POA: Diagnosis not present

## 2020-10-22 DIAGNOSIS — N400 Enlarged prostate without lower urinary tract symptoms: Secondary | ICD-10-CM | POA: Diagnosis present

## 2020-10-22 DIAGNOSIS — E11319 Type 2 diabetes mellitus with unspecified diabetic retinopathy without macular edema: Secondary | ICD-10-CM | POA: Diagnosis not present

## 2020-10-22 LAB — GLUCOSE, CAPILLARY
Glucose-Capillary: 107 mg/dL — ABNORMAL HIGH (ref 70–99)
Glucose-Capillary: 172 mg/dL — ABNORMAL HIGH (ref 70–99)
Glucose-Capillary: 95 mg/dL (ref 70–99)
Glucose-Capillary: 106 mg/dL — ABNORMAL HIGH (ref 70–99)

## 2020-10-22 LAB — COMPREHENSIVE METABOLIC PANEL
ALT: 25 U/L (ref 0–44)
AST: 22 U/L (ref 15–41)
Albumin: 3.7 g/dL (ref 3.5–5.0)
Alkaline Phosphatase: 110 U/L (ref 38–126)
Anion gap: 8 (ref 5–15)
BUN: 18 mg/dL (ref 8–23)
CO2: 31 mmol/L (ref 22–32)
Calcium: 9.3 mg/dL (ref 8.9–10.3)
Chloride: 101 mmol/L (ref 98–111)
Creatinine, Ser: 1.08 mg/dL (ref 0.61–1.24)
GFR, Estimated: >60 mL/min (ref >60)
Glucose, Bld: 137 mg/dL — ABNORMAL HIGH (ref 70–99)
Potassium: 3.5 mmol/L (ref 3.5–5.1)
Sodium: 140 mmol/L (ref 135–145)
Total Bilirubin: 1.3 mg/dL — ABNORMAL HIGH (ref 0.3–1.2)
Total Protein: 6.5 g/dL (ref 6.5–8.1)

## 2020-10-22 LAB — ECHOCARDIOGRAM COMPLETE
Area-P 1/2: 3.82 cm2
Height: 71 in
S' Lateral: 3.6 cm
Weight: 3982.4 oz

## 2020-10-22 LAB — CBC
HCT: 39.7 % (ref 39.0–52.0)
Hemoglobin: 12.6 g/dL — ABNORMAL LOW (ref 13.0–17.0)
MCH: 27.1 pg (ref 26.0–34.0)
MCHC: 31.7 g/dL (ref 30.0–36.0)
MCV: 85.4 fL (ref 80.0–100.0)
Platelets: 132 10*3/uL — ABNORMAL LOW (ref 150–400)
RBC: 4.65 MIL/uL (ref 4.22–5.81)
RDW: 17.1 % — ABNORMAL HIGH (ref 11.5–15.5)
WBC: 5 10*3/uL (ref 4.0–10.5)
nRBC: 0 % (ref 0.0–0.2)

## 2020-10-22 LAB — MAGNESIUM: Magnesium: 2.3 mg/dL (ref 1.7–2.4)

## 2020-10-22 MED ORDER — IRBESARTAN 75 MG PO TABS
37.5000 mg | ORAL_TABLET | Freq: Every day | ORAL | Status: DC
  Administered 2020-10-23 – 2020-10-25 (×3): 37.5 mg via ORAL
  Filled 2020-10-22 (×3): qty 0.5

## 2020-10-22 MED ORDER — FUROSEMIDE 10 MG/ML IJ SOLN
60.0000 mg | Freq: Two times a day (BID) | INTRAMUSCULAR | Status: DC
  Administered 2020-10-22 – 2020-10-23 (×3): 60 mg via INTRAVENOUS
  Filled 2020-10-22 (×3): qty 6

## 2020-10-22 MED ORDER — PERFLUTREN LIPID MICROSPHERE
1.0000 mL | INTRAVENOUS | Status: AC | PRN
  Administered 2020-10-22: 2 mL via INTRAVENOUS
  Filled 2020-10-22: qty 10

## 2020-10-22 MED ORDER — IRBESARTAN 75 MG PO TABS: 37.5000 mg | ORAL_TABLET | Freq: Every day | ORAL | Status: DC

## 2020-10-22 NOTE — TOC Progression Note (Signed)
Transition of Care [facility]) - Progression Note    Patient Details  Name: Chad Hahn MRN: [id number] Date of Birth: Aug 01, 1945  Transition of Care [facility]) CM/SW Contact  Leone Haven, RN Phone Number: 10/22/2020, 1:39 PM  Clinical Narrative:    NCM spoke with patient about assisting with PCP.  He states he would like for his daughter to find a PCP for him. NCM gave him the [facility] information and a list with some doctors on it to assist him in this.  He was very Adult nurse.         Expected Discharge Plan and Services                                                 Social Determinants of Health (SDOH) Interventions    Readmission Risk Interventions No flowsheet data found.

## 2020-10-22 NOTE — Progress Notes (Signed)
  Echocardiogram 2D Echocardiogram has been performed.  Chad Hahn 10/22/2020, 12:03 PM

## 2020-10-22 NOTE — Progress Notes (Signed)
Pt states he is able to place CPAP on himself when ready for bed. Advised pt to notify for RT if any further assistance is needed.  

## 2020-10-22 NOTE — Progress Notes (Signed)
PROGRESS NOTE    Rondall Radigan  IFO:[id number] DOB: 27-May-1945 DOA: 10/21/2020 PCP: System, Provider Not In    Brief Narrative:  Mr. Slowey was admitted to the hospital with the working diagnosis of acute on chronic systolic heart failure decompensation.   75 year old male past medical history for heart failure, hypertension, coronary artery disease status post bypass grafting, type 2 diabetes mellitus and obstructive sleep apnea.  Reported few weeks of worsening dyspnea, and lower extremity edema, associated with chest pressure, no improving worsening factors.  Currently visiting his daughter from [place].  07/2020 hospitalization in [place] for cholecystitis status postcholecystectomy, and then he had exacerbation of his heart failure, ejection fraction 30 to 40%, at home taking furosemide. On his initial physical examination blood pressure 146/87, heart rate 70, respiratory rate 22, oxygen saturation 98% on supplemental oxygen.  His lungs had no wheezing or rales, heart S1-S2, present, rhythmic, soft abdomen, positive bilateral lower extremity edema.  Sodium 139, potassium 3.6, chloride 102, bicarb 27, glucose 111, BUN 18, creatinine 0.91, BNP 267, high sensitive troponin 15-16, white count 5.3, hemoglobin 13.3, hematocrit 42.9, platelets 149. SARS COVID-19 negative.  Urinalysis specific gravity 1.006.  Chest radiograph with cardiomegaly, positive hilar vascular congestion.  EKG 70 bpm, rightward axis, QTC 542, prolonged QRS, a sensed, V paced, no significant ST segment or T wave changes.  Assessment & Plan:   Principal Problem:   Acute on chronic systolic CHF (congestive heart failure) (HCC) Active Problems:   Essential hypertension   Ischemic congestive cardiomyopathy (HCC)   Obstructive sleep apnea syndrome   Chest pain, rule out acute myocardial infarction   Diabetes (HCC)   CAD (coronary artery disease)   Acute on chronic systolic heart failure exacerbation Patient  continue to be hyervolemia with positive lower extremity edema and dyspnea.  Documented urine output is 1,025 ml, systolic blood pressure is 121 mmHg, warm lower extremities.   Plan to continue aggressive diuresis with furosemide, increase dose to 60 mg IV q12. Change lisinopril to valsartan and continue beta blockade with carvedilol. Follow with echocardiogram report.  Continue telemetry monitoring.   2. CAD/ dyslipidemia. Sp CABG in the past. No angina or chest pain. He ruled out for acute coronary syndrome.  Continue antiplatelet therapy with asa and clopidogrel.  On rosuvastatin    3. HTN. Blood pressure control with valsartan and carvedilol.  4. T2DM. Continue glucose cover and monitoring with insulin sliding scale, patient is tolerating po well.   5. BPH. No urinary retention, continue with tamsulosin and finesteride   Patient continue to be at high risk for worsening heart failure decompensation   Status is: Observation  The patient will require care spanning > 2 midnights and should be moved to inpatient because: IV treatments appropriate due to intensity of illness or inability to take PO  Dispo: The patient is from: Home              Anticipated d/c is to: Home              Patient currently is not medically stable to d/c.   Difficult to place patient No   DVT prophylaxis: Enoxaparin   Code Status:   full  Family Communication:  No family at the bedside     Subjective: Patient continue to have dyspnea and decreased physical functional capacity, not yet back to his baseline, positive lower extremity edema, no nausea or vomiting,   Objective: Vitals:   10/22/20 0402 10/22/20 0827 10/22/20 0848 10/22/20 1209  BP:  131/78 132/88 139/82 121/81  Pulse: 70 70 68 70  Resp: 20 18 20 18   Temp: 97.8 F (36.6 C)  (!) 97.5 F (36.4 C) 97.9 F (36.6 C)  TempSrc: Oral  Oral Oral  SpO2: 96% 97% 97% 97%  Weight:      Height:        Intake/Output Summary (Last 24 hours)  at 10/22/2020 1413 Last data filed at 10/22/2020 1229 Gross per 24 hour  Intake 480 ml  Output 1950 ml  Net -1470 ml   Filed Weights   10/22/20 0306  Weight: 112.9 kg    Examination:   General: Not in pain or dyspnea, deconditioned  Neurology: Awake and alert, non focal  E ENT: mild pallor, no icterus, oral mucosa moist Cardiovascular: No JVD. S1-S2 present, rhythmic, no gallops, rubs, or murmurs. +++ pitting bilateral lower extremity edema. Pulmonary: vesicular breath sounds bilaterally, with no wheezing, rhonchi positive bilateral bibasilar rales. Gastrointestinal. Abdomen protuberant  Skin. No rashes Musculoskeletal: no joint deformities     Data Reviewed: I have personally reviewed following labs and imaging studies  CBC: Recent Labs  Lab 10/21/20 1240 10/22/20 0152  WBC 5.3 5.0  HGB 13.3 12.6*  HCT 42.9 39.7  MCV 87.7 85.4  PLT 149* 132*   Basic Metabolic Panel: Recent Labs  Lab 10/21/20 1240 10/21/20 2232 10/22/20 0152  NA 139  --  140  K 3.6  --  3.5  CL 102  --  101  CO2 27  --  31  GLUCOSE 111*  --  137*  BUN 18  --  18  CREATININE 0.91  --  1.08  CALCIUM 9.6  --  9.3  MG  --  2.3  --    GFR: Estimated Creatinine Clearance: 76.6 mL/min (by C-G formula based on SCr of 1.08 mg/dL). Liver Function Tests: Recent Labs  Lab 10/21/20 1240 10/22/20 0152  AST 27 22  ALT 29 25  ALKPHOS 120 110  BILITOT 1.1 1.3*  PROT 7.2 6.5  ALBUMIN 4.0 3.7   Recent Labs  Lab 10/21/20 1240  LIPASE 45   No results for input(s): AMMONIA in the last 168 hours. Coagulation Profile: No results for input(s): INR, PROTIME in the last 168 hours. Cardiac Enzymes: No results for input(s): CKTOTAL, CKMB, CKMBINDEX, TROPONINI in the last 168 hours. BNP (last 3 results) No results for input(s): PROBNP in the last 8760 hours. HbA1C: No results for input(s): HGBA1C in the last 72 hours. CBG: Recent Labs  Lab 10/21/20 2240 10/22/20 0615 10/22/20 1205  GLUCAP 121*  107* 172*   Lipid Profile: No results for input(s): CHOL, HDL, LDLCALC, TRIG, CHOLHDL, LDLDIRECT in the last 72 hours. Thyroid Function Tests: No results for input(s): TSH, T4TOTAL, FREET4, T3FREE, THYROIDAB in the last 72 hours. Anemia Panel: No results for input(s): VITAMINB12, FOLATE, FERRITIN, TIBC, IRON, RETICCTPCT in the last 72 hours.    Radiology Studies: I have reviewed all of the imaging during this hospital visit personally     Scheduled Meds:  aspirin EC  81 mg Oral Daily   carvedilol  25 mg Oral BID WC   clopidogrel  75 mg Oral Daily   enoxaparin (LOVENOX) injection  40 mg Subcutaneous Daily   finasteride  5 mg Oral QHS   furosemide  40 mg Oral Daily   insulin aspart  0-15 Units Subcutaneous TID WC   lisinopril  10 mg Oral Daily   rosuvastatin  40 mg Oral QHS   sodium chloride  flush  3 mL Intravenous Q12H   tamsulosin  0.4 mg Oral Daily   Continuous Infusions:   LOS: 0 days        Ramsey Midgett Annett Gula, MD

## 2020-10-22 NOTE — Care Management Obs Status (Signed)
MEDICARE OBSERVATION STATUS NOTIFICATION   Patient Details  Name: Chad Hahn MRN: [id number] Date of Birth: 1945-09-09   Medicare Observation Status Notification Given:  Yes    Leone Haven, RN 10/22/2020, 1:29 PM

## 2020-10-22 NOTE — Plan of Care (Signed)
  Problem: Education: Goal: Ability to demonstrate management of disease process will improve Outcome: Progressing   Problem: Education: Goal: Ability to verbalize understanding of medication therapies will improve Outcome: Progressing   

## 2020-10-23 DIAGNOSIS — I10 Essential (primary) hypertension: Secondary | ICD-10-CM

## 2020-10-23 DIAGNOSIS — I5043 Acute on chronic combined systolic (congestive) and diastolic (congestive) heart failure: Secondary | ICD-10-CM

## 2020-10-23 LAB — BASIC METABOLIC PANEL
Anion gap: 8 (ref 5–15)
BUN: 18 mg/dL (ref 8–23)
CO2: 30 mmol/L (ref 22–32)
Calcium: 9.4 mg/dL (ref 8.9–10.3)
Chloride: 102 mmol/L (ref 98–111)
Creatinine, Ser: 1.03 mg/dL (ref 0.61–1.24)
GFR, Estimated: >60 mL/min (ref >60)
Glucose, Bld: 115 mg/dL — ABNORMAL HIGH (ref 70–99)
Potassium: 3.6 mmol/L (ref 3.5–5.1)
Sodium: 140 mmol/L (ref 135–145)

## 2020-10-23 LAB — GLUCOSE, CAPILLARY
Glucose-Capillary: 151 mg/dL — ABNORMAL HIGH (ref 70–99)
Glucose-Capillary: 128 mg/dL — ABNORMAL HIGH (ref 70–99)
Glucose-Capillary: 144 mg/dL — ABNORMAL HIGH (ref 70–99)
Glucose-Capillary: 125 mg/dL — ABNORMAL HIGH (ref 70–99)

## 2020-10-23 MED ORDER — POTASSIUM CHLORIDE CRYS ER 20 MEQ PO TBCR
40.0000 meq | EXTENDED_RELEASE_TABLET | Freq: Once | ORAL | Status: AC
  Administered 2020-10-23: 40 meq via ORAL
  Filled 2020-10-23: qty 2

## 2020-10-23 MED ORDER — FUROSEMIDE 10 MG/ML IJ SOLN
60.0000 mg | Freq: Two times a day (BID) | INTRAMUSCULAR | Status: DC
  Administered 2020-10-24 – 2020-10-26 (×5): 60 mg via INTRAVENOUS
  Filled 2020-10-23 (×5): qty 6

## 2020-10-23 MED ORDER — SPIRONOLACTONE 25 MG PO TABS
25.0000 mg | ORAL_TABLET | Freq: Every day | ORAL | Status: DC
  Administered 2020-10-23 – 2020-10-27 (×5): 25 mg via ORAL
  Filled 2020-10-23 (×5): qty 1

## 2020-10-23 NOTE — Evaluation (Signed)
Physical Therapy Evaluation & Discharge Patient Details Name: Chad Hahn MRN: [id number] DOB: 16-Jan-1946 Today's Date: 10/23/2020   History of Present Illness  Pt is a 75 y.o. male admitted 10/21/20 with worsening SOB, BLE edema, and chest pressure; pt currently in town visiting his daughter from [place]. Workup for acute on chronic CHF exacerbation. PMH includes HF, HTN, CAD (s/p CABG), DM2, OSA.   Clinical Impression  Patient evaluated by Physical Therapy with no further acute PT needs identified. PTA, pt independent, working, current in town visiting his daughter. Today, pt independent with mobility and ADL tasks; DOE up to 2/4 with activity. Educ re: activity recommendations (walking program), importance of mobility. All education has been completed and the patient has no further questions. Acute PT is signing off. Thank you for this referral.  SpO2 96% on RA, HR 118 with ambulation    Follow Up Recommendations No PT follow up    Equipment Recommendations  None recommended by PT    Recommendations for Other Services       Precautions / Restrictions Precautions Precautions: None Restrictions Weight Bearing Restrictions: No      Mobility  Bed Mobility Overal bed mobility: Independent             General bed mobility comments: received sitting in recliner    Transfers Overall transfer level: Independent Equipment used: None             General transfer comment: Pt was Independent for bed, toilet and chair transfers as performed during assessment today.  Ambulation/Gait Ambulation/Gait assistance: Independent Gait Distance (Feet): 450 Feet Assistive device: None Gait Pattern/deviations: WFL(Within Functional Limits)        Stairs Stairs: Yes Stairs assistance: Modified independent (Device/Increase time) Stair Management: One rail Left;Alternating pattern;Step to pattern;Forwards Number of Stairs: 12 General stair comments: Mod indep with rail  support ascend/descending steps  Wheelchair Mobility    Modified Rankin (Stroke Patients Only)       Balance Overall balance assessment: Modified Independent                           High level balance activites: Side stepping;Backward walking;Direction changes;Turns;Sudden stops;Head turns High Level Balance Comments: No overt instability or LOB with higher level balance tasks; pt able to squat down to touch lower leg without UE support             Pertinent Vitals/Pain Pain Assessment: No/denies pain    Home Living Family/patient expects to be discharged to:: Private residence Living Arrangements: Children Available Help at Discharge: Family;Available PRN/intermittently Type of Home: House Home Access: Stairs to enter Entrance Stairs-Rails: None Entrance Stairs-Number of Steps: 4 Home Layout: Two level Home Equipment: None Additional Comments: Home information above is based on pt daughter's house he will discharge there prior to returning home to [place].    Prior Function Level of Independence: Independent         Comments: Drives, enjoys traveling, works as Administrator: Right    Extremity/Trunk Assessment   Upper Extremity Assessment Upper Extremity Assessment: Overall WFL for tasks assessed    Lower Extremity Assessment Lower Extremity Assessment: Overall WFL for tasks assessed (strength/ROM WFL; noted lower leg/ankle swelling and redness, pt reports this has improved since admission)       Communication   Communication: No difficulties  Cognition Arousal/Alertness: Awake/alert Behavior During Therapy: WFL for tasks assessed/performed Overall Cognitive Status: Within Functional  Limits for tasks assessed                                        General Comments General comments (skin integrity, edema, etc.): SpO2 96% on RA, HR 118. Pt still with BLE swelling, reports sitting a lot with legs  down, educ on importance of elevation. Educ re: activity recommendations (walking program), importance of mobility    Exercises     Assessment/Plan    PT Assessment Patent does not need any further PT services  PT Problem List         PT Treatment Interventions      PT Goals (Current goals can be found in the Care Plan section)  Acute Rehab PT Goals Patient Stated Goal:  (Go home to [place] and move in the next several months w/ wife) PT Goal Formulation: All assessment and education complete, DC therapy    Frequency     Barriers to discharge        Co-evaluation               AM-PAC PT "6 Clicks" Mobility  Outcome Measure Help needed turning from your back to your side while in a flat bed without using bedrails?: None Help needed moving from lying on your back to sitting on the side of a flat bed without using bedrails?: None Help needed moving to and from a bed to a chair (including a wheelchair)?: None Help needed standing up from a chair using your arms (e.g., wheelchair or bedside chair)?: None Help needed to walk in hospital room?: None Help needed climbing 3-5 steps with a railing? : None 6 Click Score: 24    End of Session   Activity Tolerance: Patient tolerated treatment well Patient left: in chair;with call bell/phone within reach Nurse Communication: Mobility status PT Visit Diagnosis: Other abnormalities of gait and mobility (R26.89)    Time: 2951-8841 PT Time Calculation (min) (ACUTE ONLY): 14 min   Charges:   PT Evaluation $PT Eval Low Complexity: 1 Low        Ina Homes, PT, DPT Acute Rehabilitation Services  Pager [phone] Office [phone]  Malachy Chamber 10/23/2020, 11:53 AM

## 2020-10-23 NOTE — Therapy (Signed)
Occupational Therapy Evaluation Patient Details Name: Chad Hahn MRN: [id number] DOB: 03/25/1946 Today's Date: 10/23/2020    History of Present Illness Pt is a 75 y.o. male admitted 10/21/20 with worsening SOB, BLE edema, and chest pressure; pt currently in town visiting his daughter from [place]. Workup for acute on chronic CHF exacerbation. PMH includes HF, HTN, CAD (s/p CABG), DM2, OSA.   Clinical Impression   Pt is a pleasant 75 y/o right hand dominant male, whom was assessed by OT in the acute setting. He is currently Mod I - independent for ADL's, self care and functional mobility/transfers. Discussed benefits to pacing functional activities during the day, taking rest breaks PRN and sitting vs standing for tasks when fatigued. Pt denies need for DME and plans to d/c home to his daughter's house prior to returning home to [place] when able. Will sign off OT at this time.    Follow Up Recommendations  No OT follow up (Pt plans to return to daughter's home until he is well enough to go back home to [facility])    Equipment Recommendations  None recommended by OT    Recommendations for Other Services       Precautions / Restrictions Precautions Precautions: Fall      Mobility Bed Mobility Overal bed mobility: Independent                  Transfers Overall transfer level: Independent Equipment used: None             General transfer comment: Pt was Independent for bed, toilet and chair transfers as performed during assessment today.    Balance Overall balance assessment: Modified Independent            ADL either performed or assessed with clinical judgement   ADL Overall ADL's : Modified independent Eating/Feeding: Independent   Grooming: Independent;Standing (Standing at sink)   Upper Body Bathing: Independent;Sitting;Standing   Lower Body Bathing: Modified independent;Sit to/from stand   Upper Body Dressing : Independent   Lower Body Dressing:  Independent;Sit to/from stand   Toilet Transfer: Independent;Ambulation;Regular Toilet   Toileting- Architect and Hygiene: Independent;Sit to/from stand   Tub/ Engineer, structural: Walk-in shower;Modified independent;Ambulation   Functional mobility during ADLs: Independent (w/ assistive device) General ADL Comments: Pt is overall Mod- independent with ADL's and functional mobility/transfers as performed during assessment today. (Discussed taking PRN rest breaks, sitting for tasks as needed. He verbalized understanding of this and denied need for further acute OT at this time. Denies need for DME)     Vision Baseline Vision/History: Wears glasses       Perception     Praxis      Pertinent Vitals/Pain Pain Assessment: No/denies pain     Hand Dominance Right   Extremity/Trunk Assessment Upper Extremity Assessment Upper Extremity Assessment: Overall WFL for tasks assessed   Lower Extremity Assessment Lower Extremity Assessment: Defer to PT evaluation       Communication Communication Communication: No difficulties   Cognition Arousal/Alertness: Awake/alert Behavior During Therapy: WFL for tasks assessed/performed Overall Cognitive Status: Within Functional Limits for tasks assessed                                     General Comments       Exercises     Shoulder Instructions      Home Living Family/patient expects to be discharged to:: Private residence Living Arrangements: Children (  Pt is currently staying w/ daughter in [place], he is from [place]. He -plans to return home w/ wife whom is there.Daughter is cardiac RN) Available Help at Discharge: Family;Available PRN/intermittently Type of Home: House (Daughters house) Home Access: Stairs to enter Secretary/administrator of Steps: 4 no raiols   Home Layout: Two level Alternate Level Stairs-Number of Steps:  (full flight rails on right)   Bathroom Shower/Tub: Walk-in  shower;Door   [employer] Toilet: Standard     Home Equipment: None   Additional Comments: Home information above is based on pt daughter's house he will discharge there prior to returning home to [place].      Prior Functioning/Environment Level of Independence: Independent                 OT Problem List:        OT Treatment/Interventions:      OT Goals(Current goals can be found in the care plan section) Acute Rehab OT Goals Patient Stated Goal:  (Go home to [place] and move in the next several months w/ wife) OT Goal Formulation: All assessment and education complete, DC therapy  OT Frequency:     Barriers to D/C:            Co-evaluation              AM-PAC OT "6 Clicks" Daily Activity     Outcome Measure Help from another person eating meals?: None Help from another person taking care of personal grooming?: None Help from another person toileting, which includes using toliet, bedpan, or urinal?: None Help from another person bathing (including washing, rinsing, drying)?: None Help from another person to put on and taking off regular upper body clothing?: None Help from another person to put on and taking off regular lower body clothing?: None 6 Click Score: 24   End of Session Equipment Utilized During Treatment:  (None)  Activity Tolerance: Patient tolerated treatment well (Pt verbalized understanding to take rest breaks PRN) Patient left: in chair;Other (comment) (w/ PT in room starting evaluation)  OT Visit Diagnosis: Muscle weakness (generalized) (M62.81)                Time: 1287-8676 OT Time Calculation (min): 19 min Charges:  OT General Charges $OT Visit: 1 Visit OT Evaluation $OT Eval Low Complexity: 1 Low  Corrion Stirewalt Beth Dixon, OTR/L 10/23/2020, 11:45 AM

## 2020-10-23 NOTE — Progress Notes (Addendum)
PROGRESS NOTE    Chad Hahn  GMW:[id number] DOB: Jun 23, 1945 DOA: 10/21/2020 PCP: System, Provider Not In    Brief Narrative:  Chad Hahn was admitted to the hospital with the working diagnosis of acute on chronic systolic heart failure decompensation.    74 year old male past medical history for heart failure, hypertension, coronary artery disease status post bypass grafting, type 2 diabetes mellitus and obstructive sleep apnea.  Reported few weeks of worsening dyspnea, and lower extremity edema, associated with chest pressure, no improving worsening factors.  Currently visiting his daughter from [place].  07/2020 hospitalization in [place] for cholecystitis status postcholecystectomy, and then he had exacerbation of his heart failure, ejection fraction 30 to 40%, at home taking furosemide. On his initial physical examination blood pressure 146/87, heart rate 70, respiratory rate 22, oxygen saturation 98% on supplemental oxygen.  His lungs had no wheezing or rales, heart S1-S2, present, rhythmic, soft abdomen, positive bilateral lower extremity edema.   Sodium 139, potassium 3.6, chloride 102, bicarb 27, glucose 111, BUN 18, creatinine 0.91, BNP 267, high sensitive troponin 15-16, white count 5.3, hemoglobin 13.3, hematocrit 42.9, platelets 149. SARS COVID-19 negative.   Urinalysis specific gravity 1.006.   Chest radiograph with cardiomegaly, positive hilar vascular congestion.   EKG 70 bpm, rightward axis, QTC 542, prolonged QRS, a sensed, V paced, no significant ST segment or T wave changes.  Patient placed on aggressive diuresis with good toleration.  Echocardiogram with worsening biventricular failure.   Assessment & Plan:   Principal Problem:   Acute on chronic systolic CHF (congestive heart failure) (HCC) Active Problems:   Essential hypertension   Ischemic congestive cardiomyopathy (HCC)   Obstructive sleep apnea syndrome   Chest pain, rule out acute myocardial  infarction   Diabetes (HCC)   CAD (coronary artery disease)   CHF (congestive heart failure) (HCC)    Acute on chronic systolic heart failure exacerbation, biventricular failure.  Ischemic cardiomyopathy.  Edema improving but not yet back to baseline, dyspnea improved. Urine output 5.325 ml over last 24 hrs. Blood pressure systolic 116 mmHg.    echocardiogram with further reduction in LV systolic function 20 to 25%, entire apex is akinetic and possible aneurysmal. Severe LV dilatation. RV systolic function severely reduced. RSVP 33,8 mmHg.   Continue furosemide with 60 mg IV q12. Continue with irbersartan and will add spironolactone.  Transition to [facility] at discharge.  Patient will need heart failure team for follow up, may need AICD in the near future.     2. CAD/ dyslipidemia. Sp CABG in the past. No angina or chest pain. He ruled out for acute coronary syndrome.   Continue medical therapy with aspirin, clopidogrel and atorvastatin.    3. HTN. Continue with valsartan and carvedilol.   4. T2DM. Fasting glucose is 115 today, will continue glucose cover and monitoring with insulin sliding scale.    5. BPH. No signs of urinary retention, on tamsulosin and finesteride    6. Hypokalemia. Target K is 4 and Mg at 2. Will add 40 meq Kcl and will check electrolytes in am. His renal function is stable with serum cr at 1,0   7. Obesity class 1. Calculated BMI is 34.1  Patient continue to be at high risk for worsening heart failure   Status is: Inpatient  Remains inpatient appropriate because:Hemodynamically unstable and IV treatments appropriate due to intensity of illness or inability to take PO  Dispo: The patient is from: Home  Anticipated d/c is to: Home              Patient currently is not medically stable to d/c.   Difficult to place patient No   DVT prophylaxis: Enoxaparin   Code Status:   full  Family Communication:  No family at the bedside       Subjective: Patient with improved dyspnea but continue to have lower extremity edema, no nausea or vomiting, no chest pain,   Objective: Vitals:   10/22/20 1950 10/23/20 0324 10/23/20 0817 10/23/20 1155  BP: 121/74 133/83 119/68 116/72  Pulse: 69 70 70 70  Resp: 15 18    Temp: 98.3 F (36.8 C) 97.7 F (36.5 C) 98.6 F (37 C)   TempSrc: Oral Oral Oral   SpO2: 96% 96% 97% 94%  Weight:  110.9 kg    Height:        Intake/Output Summary (Last 24 hours) at 10/23/2020 1548 Last data filed at 10/23/2020 1533 Gross per 24 hour  Intake 480 ml  Output 5050 ml  Net -4570 ml   Filed Weights   10/22/20 0306 10/23/20 0324  Weight: 112.9 kg 110.9 kg    Examination:   General: Not in pain or dyspnea  Neurology: Awake and alert, non focal  E ENT: mild pallor, no icterus, oral mucosa moist Cardiovascular: No JVD. S1-S2 present, rhythmic, no gallops, rubs, or murmurs. +++ pitting bilateral lower extremity edema. Pulmonary: positive breath sounds bilaterally, adequate air movement, no wheezing, rhonchi or rales. Gastrointestinal. Abdomen soft and non tender Skin. No rashes Musculoskeletal: no joint deformities     Data Reviewed: I have personally reviewed following labs and imaging studies  CBC: Recent Labs  Lab 10/21/20 1240 10/22/20 0152  WBC 5.3 5.0  HGB 13.3 12.6*  HCT 42.9 39.7  MCV 87.7 85.4  PLT 149* 132*   Basic Metabolic Panel: Recent Labs  Lab 10/21/20 1240 10/21/20 2232 10/22/20 0152 10/23/20 0318  NA 139  --  140 140  K 3.6  --  3.5 3.6  CL 102  --  101 102  CO2 27  --  31 30  GLUCOSE 111*  --  137* 115*  BUN 18  --  18 18  CREATININE 0.91  --  1.08 1.03  CALCIUM 9.6  --  9.3 9.4  MG  --  2.3  --   --    GFR: Estimated Creatinine Clearance: 79.7 mL/min (by C-G formula based on SCr of 1.03 mg/dL). Liver Function Tests: Recent Labs  Lab 10/21/20 1240 10/22/20 0152  AST 27 22  ALT 29 25  ALKPHOS 120 110  BILITOT 1.1 1.3*  PROT 7.2 6.5   ALBUMIN 4.0 3.7   Recent Labs  Lab 10/21/20 1240  LIPASE 45   No results for input(s): AMMONIA in the last 168 hours. Coagulation Profile: No results for input(s): INR, PROTIME in the last 168 hours. Cardiac Enzymes: No results for input(s): CKTOTAL, CKMB, CKMBINDEX, TROPONINI in the last 168 hours. BNP (last 3 results) No results for input(s): PROBNP in the last 8760 hours. HbA1C: No results for input(s): HGBA1C in the last 72 hours. CBG: Recent Labs  Lab 10/22/20 1613 10/22/20 2115 10/23/20 0624 10/23/20 1040 10/23/20 1532  GLUCAP 95 106* 151* 128* 144*   Lipid Profile: No results for input(s): CHOL, HDL, LDLCALC, TRIG, CHOLHDL, LDLDIRECT in the last 72 hours. Thyroid Function Tests: No results for input(s): TSH, T4TOTAL, FREET4, T3FREE, THYROIDAB in the last 72 hours. Anemia Panel: No  results for input(s): VITAMINB12, FOLATE, FERRITIN, TIBC, IRON, RETICCTPCT in the last 72 hours.    Radiology Studies: I have reviewed all of the imaging during this hospital visit personally     Scheduled Meds:  aspirin EC  81 mg Oral Daily   carvedilol  25 mg Oral BID WC   clopidogrel  75 mg Oral Daily   enoxaparin (LOVENOX) injection  40 mg Subcutaneous Daily   finasteride  5 mg Oral QHS   furosemide  60 mg Intravenous Q12H   insulin aspart  0-15 Units Subcutaneous TID WC   irbesartan  37.5 mg Oral Daily   rosuvastatin  40 mg Oral QHS   sodium chloride flush  3 mL Intravenous Q12H   tamsulosin  0.4 mg Oral Daily   Continuous Infusions:   LOS: 1 day        Dmauri Rosenow Annett Gula, MD

## 2020-10-23 NOTE — Progress Notes (Signed)
Pt is able to place himself on CPAP when ready for bed. Pt to notify for RT if any further assistance is needed with machine settings. RT will continue to monitor.

## 2020-10-24 ENCOUNTER — Encounter (HOSPITAL_COMMUNITY): Payer: Self-pay | Admitting: Internal Medicine

## 2020-10-24 LAB — BASIC METABOLIC PANEL
Anion gap: 9 (ref 5–15)
BUN: 21 mg/dL (ref 8–23)
CO2: 28 mmol/L (ref 22–32)
Calcium: 9.2 mg/dL (ref 8.9–10.3)
Chloride: 102 mmol/L (ref 98–111)
Creatinine, Ser: 1.02 mg/dL (ref 0.61–1.24)
GFR, Estimated: >60 mL/min (ref >60)
Glucose, Bld: 117 mg/dL — ABNORMAL HIGH (ref 70–99)
Potassium: 3.4 mmol/L — ABNORMAL LOW (ref 3.5–5.1)
Sodium: 139 mmol/L (ref 135–145)

## 2020-10-24 LAB — MAGNESIUM: Magnesium: 2.1 mg/dL (ref 1.7–2.4)

## 2020-10-24 LAB — GLUCOSE, CAPILLARY
Glucose-Capillary: 123 mg/dL — ABNORMAL HIGH (ref 70–99)
Glucose-Capillary: 111 mg/dL — ABNORMAL HIGH (ref 70–99)
Glucose-Capillary: 115 mg/dL — ABNORMAL HIGH (ref 70–99)
Glucose-Capillary: 152 mg/dL — ABNORMAL HIGH (ref 70–99)

## 2020-10-24 MED ORDER — POTASSIUM CHLORIDE CRYS ER 20 MEQ PO TBCR
40.0000 meq | EXTENDED_RELEASE_TABLET | ORAL | Status: AC
  Administered 2020-10-24 (×2): 40 meq via ORAL
  Filled 2020-10-24 (×2): qty 2

## 2020-10-24 MED ORDER — EMPAGLIFLOZIN 10 MG PO TABS
10.0000 mg | ORAL_TABLET | Freq: Every day | ORAL | Status: DC
  Administered 2020-10-24 – 2020-10-27 (×4): 10 mg via ORAL
  Filled 2020-10-24 (×4): qty 1

## 2020-10-24 NOTE — Progress Notes (Addendum)
PROGRESS NOTE    Chad Hahn  KPT:[id number] DOB: Sep 05, 1945 DOA: 10/21/2020 PCP: System, Provider Not In    Brief Narrative:  Chad Hahn was admitted to the hospital with the working diagnosis of acute on chronic systolic heart failure decompensation.    75 year old male, Chad Hahn with  past medical history for heart failure, hypertension, coronary artery disease status post bypass grafting, type 2 diabetes mellitus and obstructive sleep apnea.  Reported few weeks of worsening dyspnea, and lower extremity edema, associated with chest pressure, no improving worsening factors.  Currently visiting his daughter from [place].  07/2020 hospitalization in [place] for cholecystitis status postcholecystectomy, and then he had exacerbation of his heart failure, ejection fraction 30 to 40%, at home taking furosemide. On his initial physical examination blood pressure 146/87, heart rate 70, respiratory rate 22, oxygen saturation 98% on supplemental oxygen.  His lungs had no wheezing or rales, heart S1-S2, present, rhythmic, soft abdomen, positive bilateral lower extremity edema.   Sodium 139, potassium 3.6, chloride 102, bicarb 27, glucose 111, BUN 18, creatinine 0.91, BNP 267, high sensitive troponin 15-16, white count 5.3, hemoglobin 13.3, hematocrit 42.9, platelets 149. SARS COVID-19 negative.   Urinalysis specific gravity 1.006.   Chest radiograph with cardiomegaly, positive hilar vascular congestion.   EKG 70 bpm, rightward axis, QTC 542, prolonged QRS, a sensed, V paced, no significant ST segment or T wave changes.   Patient placed on aggressive diuresis with good toleration.   Echocardiogram with worsening biventricular failure.   Heart failure guideline therapy in place and adjusting medical regimen.  Per echocardiography worsening LV systolic function, in the setting of advanced, can not rule out recent ischemic event. Consult cardiology for possible ischemic re-evaluation and heart  failure team follow up.    Assessment & Plan:   Principal Problem:   Acute on chronic systolic CHF (congestive heart failure) (HCC) Active Problems:   Essential hypertension   Ischemic congestive cardiomyopathy (HCC)   Obstructive sleep apnea syndrome   Chest pain, rule out acute myocardial infarction   Diabetes (HCC)   CAD (coronary artery disease)   CHF (congestive heart failure) (HCC)    Acute on chronic systolic heart failure exacerbation, biventricular failure (LV EF 20 to 25%).  Ischemic cardiomyopathy. Echocardiogram with further reduction in LV systolic function 20 to 25%, entire apex is akinetic and possible aneurysmal. Severe LV dilatation. RV systolic function severely reduced. RSVP 33,8 mmHg.  His lower extremity edema continue to improve, he has been ambulating in the hallways.  Urine output 4,050 ml over last 24 hrs, blood pressure 101 to 120 mmHg systolic with warm lower extremities.     Diuresis with furosemide with 60 mg IV q12 with good toleration.  Continue with spironolactone and irbesartan. Add empagliflozin.  Plan to start patient on Entresto before discharge.   Patient will need outpatient follow up with heart failure clinic, if no recovery of EF, may need AICD.    2. CAD/ dyslipidemia. Sp CABG in the past 1980 and 1990.  Patient had several coronary angioplasties 2014 and 2016, last cardiac catheterization in 2018. On further questioning he had occasional nausea and chest pressure at home. Recent hospitalization for cholecystis in 07/2020.  Considering sever ischemic cardiomyopathy and recent further decrease in LV systolic function, patient may benefit from another ischemic evaluation.  Consult cardiology for further recommendations.     On aspirin, clopidogrel and atorvastatin, b blocker and ARB.    3. HTN. Tolerating well irbesartan and carvedilol. Continue diuresis with furosemide and  spironolactone.    4. T2DM. glucose has been controlled, will  discontinue insulin sliding scale and will start patient on empagliflozin    5. BPH. Continue with  tamsulosin and finesteride    6. Hypokalemia. Continue K correction with KCl will give 2 doses of 40 meq today and follow up electrolytes in am. Mg is 2,1 and serum cr at 1,0 with bicarbonate at 28   7. Obesity class 1/ OSA . Calculated BMI is 34.1 Continue with Cpap.   Patient continue to be at high risk for worsening heart failure   Status is: Inpatient  Remains inpatient appropriate because:Inpatient level of care appropriate due to severity of illness  Dispo: The patient is from: Home              Anticipated d/c is to: Home              Patient currently is not medically stable to d/c.   Difficult to place patient No   DVT prophylaxis: Enoxaparin   Code Status:   Full  Family Communication:  No family at the bedside      Consultants:  Cardiology   Subjective: Patient with improving lower extremity edema and dyspnea, no current chest pain, nausea or vomiting,   Objective: Vitals:   10/23/20 1155 10/23/20 2015 10/24/20 0411 10/24/20 0800  BP: 116/72 115/70 124/75 101/63  Pulse: 70 69 70 70  Resp:  18 18   Temp:  98.2 F (36.8 C) 97.9 F (36.6 C) 97.8 F (36.6 C)  TempSrc:  Oral Oral Oral  SpO2: 94% 95% 93% 96%  Weight:   108 kg   Height:        Intake/Output Summary (Last 24 hours) at 10/24/2020 1056 Last data filed at 10/24/2020 0801 Gross per 24 hour  Intake 483 ml  Output 4325 ml  Net -3842 ml   Filed Weights   10/22/20 0306 10/23/20 0324 10/24/20 0411  Weight: 112.9 kg 110.9 kg 108 kg    Examination:   General: Not in pain or dyspnea, Neurology: Awake and alert, non focal  E ENT: mild pallor, no icterus, oral mucosa moist Cardiovascular: No JVD. S1-S2 present, rhythmic, no gallops, rubs, or murmurs. ++ pitting bilateral lower extremity edema. Pulmonary:  positive breath sounds bilaterally, no wheezing, rhonchi or rales. Gastrointestinal. Abdomen  soft and non tender Skin. No rashes Musculoskeletal: no joint deformities     Data Reviewed: I have personally reviewed following labs and imaging studies  CBC: Recent Labs  Lab 10/21/20 1240 10/22/20 0152  WBC 5.3 5.0  HGB 13.3 12.6*  HCT 42.9 39.7  MCV 87.7 85.4  PLT 149* 132*   Basic Metabolic Panel: Recent Labs  Lab 10/21/20 1240 10/21/20 2232 10/22/20 0152 10/23/20 0318 10/24/20 0351  NA 139  --  140 140 139  K 3.6  --  3.5 3.6 3.4*  CL 102  --  101 102 102  CO2 27  --  31 30 28   GLUCOSE 111*  --  137* 115* 117*  BUN 18  --  18 18 21   CREATININE 0.91  --  1.08 1.03 1.02  CALCIUM 9.6  --  9.3 9.4 9.2  MG  --  2.3  --   --  2.1   GFR: Estimated Creatinine Clearance: 79.4 mL/min (by C-G formula based on SCr of 1.02 mg/dL). Liver Function Tests: Recent Labs  Lab 10/21/20 1240 10/22/20 0152  AST 27 22  ALT 29 25  ALKPHOS 120 110  BILITOT 1.1 1.3*  PROT 7.2 6.5  ALBUMIN 4.0 3.7   Recent Labs  Lab 10/21/20 1240  LIPASE 45   No results for input(s): AMMONIA in the last 168 hours. Coagulation Profile: No results for input(s): INR, PROTIME in the last 168 hours. Cardiac Enzymes: No results for input(s): CKTOTAL, CKMB, CKMBINDEX, TROPONINI in the last 168 hours. BNP (last 3 results) No results for input(s): PROBNP in the last 8760 hours. HbA1C: No results for input(s): HGBA1C in the last 72 hours. CBG: Recent Labs  Lab 10/23/20 0624 10/23/20 1040 10/23/20 1532 10/23/20 2103 10/24/20 0537  GLUCAP 151* 128* 144* 125* 123*   Lipid Profile: No results for input(s): CHOL, HDL, LDLCALC, TRIG, CHOLHDL, LDLDIRECT in the last 72 hours. Thyroid Function Tests: No results for input(s): TSH, T4TOTAL, FREET4, T3FREE, THYROIDAB in the last 72 hours. Anemia Panel: No results for input(s): VITAMINB12, FOLATE, FERRITIN, TIBC, IRON, RETICCTPCT in the last 72 hours.    Radiology Studies: I have reviewed all of the imaging during this hospital visit  personally     Scheduled Meds:  aspirin EC  81 mg Oral Daily   carvedilol  25 mg Oral BID WC   clopidogrel  75 mg Oral Daily   enoxaparin (LOVENOX) injection  40 mg Subcutaneous Daily   finasteride  5 mg Oral QHS   furosemide  60 mg Intravenous BID   insulin aspart  0-15 Units Subcutaneous TID WC   irbesartan  37.5 mg Oral Daily   potassium chloride  40 mEq Oral Q4H   rosuvastatin  40 mg Oral QHS   sodium chloride flush  3 mL Intravenous Q12H   spironolactone  25 mg Oral Daily   tamsulosin  0.4 mg Oral Daily   Continuous Infusions:   LOS: 2 days        Nikcole Eischeid Annett Gula, MD

## 2020-10-24 NOTE — Progress Notes (Signed)
Patient will self place CPAP when ready. Patient informed to call if he should need any assistance with the CPAP

## 2020-10-24 NOTE — Consult Note (Addendum)
Cardiology Consultation:   Patient ID: Chad Hahn MRN: [id number]; DOB: [birth year]  Admit date: 10/21/2020 Date of Consult: 10/24/2020  PCP:  System, Provider Not In   [facility] Providers Cardiologist:  None   New to HeartCare Click here to update MD or APP on Care Team, Refresh:1}     Patient Profile:   Chad Hahn is a 75 y.o. male with a hx of chronic systolic CHF, St. Jude ICD 2013 with generator change 12/2018, CAD s/p CABG with redo and PCIs, DM, HTN, OSA on CPAP, BPH who is being seen 10/24/2020 for the evaluation of CHF at the request of Dr. Ella Jubilee.  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 He reports h/o CABG age 36, MI in 64 with some sort of abnormality of LAD (no PCI), redo CABG 1996, MI in 2010 without PCI, 2 stents in 2012, 1 stent in 2016, and last cath 2018 with no PCI. No records available to [place] at this time. He reports a h/o "pacemaker" placed in 2013 with a generator change in 12/2018 by his card that states this is a St. Jude ICD (suspect BiV-ICD based on leads on CXR). He recalls last EF around 30% last year. Earlier this year he was hospitalized a few months back for gallbladder issues and had to have it removed. The surgery was reported to be complicated and he required readmission shortly after for CHF requiring IV diuresis. He came to stay with his daughter here in [place] who is said to be a former cardiac nurse. For several weeks he has been experiencing increasing SOB, orthopnea, DOE, and chest pressure with exertion along with sensation of bloating. Due to his symptoms he came to the ER for valuation and was found to have elevated BNP 267, normal troponins. CXR w/ cardiomegaly but no CHF. With IV lasix he has diuresed nearly 9L thus far. He feels much better and is no longer describing chest discomfort with  ambulation but doesn't feel fully back to himself with breathing. His bloating and edema are improving. Denies any recent ICD shocks. No hx tobacco, ETOH, drug use. He planned to retire at age 68 and thinks he will settle in [place]. 2D echo showed findings below with EF 20-25%, akinetic/aneurysmal apex, mild LVH, dilated IVC. Regarding cardiac regimen, was on ASA, carvedilol, Plavix, Lasix, lisinopril and Crestor as OP. Here his ACEI was changed to irbesartan and he was started on spironolactone and Jardiance.  Telemetry and EKG show V paced rhythm though no clear overt tracking P waves, ? Atrial fib/flutter.  Past Medical History:  Diagnosis Date   BPH (benign prostatic hyperplasia)    CAD (coronary artery disease)    CABG age 64 and redo 1996, multiple PCIs as well   Chronic systolic CHF (congestive heart failure) (HCC)    DM2 (diabetes mellitus, type 2) (HCC)    Hypertension    ICD (implantable cardioverter-defibrillator) in place    St jude   Obesity    OSA on CPAP     Past Surgical History:  Procedure Laterality Date   BYPASS GRAFT ANGIOGRAPHY     CHOLECYSTECTOMY     PACEMAKER IMPLANT       Home Medications:  Prior to Admission medications   Medication Sig Start Date End Date Taking? Authorizing Provider  aspirin 81 MG EC tablet Take 81 mg by  mouth every morning.   Yes [provider]  carvedilol (COREG) 25 MG tablet Take 25 mg by mouth 2 (two) times daily with a meal.   Yes [provider]  clopidogrel (PLAVIX) 75 MG tablet Take 75 mg by mouth every morning.   Yes [provider]  finasteride (PROSCAR) 5 MG tablet Take 5 mg by mouth every evening.   Yes [provider]  furosemide (LASIX) 20 MG tablet Take 20 mg by mouth every morning.   Yes [provider]  lisinopril (ZESTRIL) 10 MG tablet Take 10 mg by mouth every morning.   Yes [provider]  metFORMIN (GLUCOPHAGE-XR) 500 MG 24 hr tablet Take 500 mg by mouth daily with  breakfast.   Yes [provider]  nitroGLYCERIN (NITROSTAT) 0.4 MG SL tablet Place 0.4 mg under the tongue every 5 (five) minutes as needed for chest pain.   Yes [provider]  rosuvastatin (CRESTOR) 40 MG tablet Take 40 mg by mouth at bedtime.   Yes [provider]  tamsulosin (FLOMAX) 0.4 MG CAPS capsule Take 0.4 mg by mouth at bedtime.   Yes [provider]    Inpatient Medications: Scheduled Meds:  aspirin EC  81 mg Oral Daily   carvedilol  25 mg Oral BID WC   clopidogrel  75 mg Oral Daily   empagliflozin  10 mg Oral Daily   enoxaparin (LOVENOX) injection  40 mg Subcutaneous Daily   finasteride  5 mg Oral QHS   furosemide  60 mg Intravenous BID   irbesartan  37.5 mg Oral Daily   rosuvastatin  40 mg Oral QHS   sodium chloride flush  3 mL Intravenous Q12H   spironolactone  25 mg Oral Daily   tamsulosin  0.4 mg Oral Daily   Continuous Infusions:  PRN Meds: acetaminophen **OR** acetaminophen, nitroGLYCERIN, polyethylene glycol  Allergies:   No Known Allergies  Social History:   Social History   Socioeconomic History   Marital status: Married    Spouse name: Not on file   Number of children: Not on file   Years of education: Not on file   Highest education level: Not on file  Occupational History   Not on file  Tobacco Use   Smoking status: Never   Smokeless tobacco: Never  Vaping Use   Vaping Use: Never used  Substance and Sexual Activity   Alcohol use: Not Currently   Drug use: Never   Sexual activity: Not on file  Other Topics Concern   Not on file  Social History Narrative   Not on file   Social Determinants of Health   Financial Resource Strain: Not on file  Food Insecurity: Not on file  Transportation Needs: Not on file  Physical Activity: Not on file  Stress: Not on file  Social Connections: Not on file  Intimate Partner Violence: Not on file    Family History:   Family History  Problem Relation Age of Onset    Heart disease Mother    Heart disease Paternal Uncle      ROS:  Please see the history of present illness.  All other ROS reviewed and negative.     Physical Exam/Data:   Vitals:   10/23/20 1155 10/23/20 2015 10/24/20 0411 10/24/20 0800  BP: 116/72 115/70 124/75 101/63  Pulse: 70 69 70 70  Resp:  18 18   Temp:  98.2 F (36.8 C) 97.9 F (36.6 C) 97.8 F (36.6 C)  TempSrc:  Oral Oral Oral  SpO2: 94% 95% 93% 96%  Weight:   108 kg   Height:        Intake/Output Summary (Last 24 hours) at 10/24/2020 1747 Last data filed at 10/24/2020 1700 Gross per 24 hour  Intake 923 ml  Output 4375 ml  Net -3452 ml   Last 3 Weights 10/24/2020 10/23/2020 10/22/2020  Weight (lbs) 238 lb 244 lb 9.6 oz 248 lb 14.4 oz  Weight (kg) 107.956 kg 110.95 kg 112.9 kg     Body mass index is 33.19 kg/m.  General: Well developed, well nourished WM, in no acute distress. Head: Normocephalic, atraumatic, sclera non-icteric, no xanthomas, nares are without discharge. Neck: Negative for carotid bruits. JVP not elevated. Lungs: Clear bilaterally to auscultation without wheezes, rales, or rhonchi. Breathing is unlabored. Heart: RRR S1 S2 without murmurs, rubs, or gallops.  Abdomen: Soft, non-tender, non-distended with normoactive bowel sounds. No rebound/guarding. Extremities: No clubbing or cyanosis. 1+ LE edema. Distal pedal pulses are 2+ and equal bilaterally. Neuro: Alert and oriented X 3. Moves all extremities spontaneously. Psych:  Responds to questions appropriately with a normal affect.  EKG:  The EKG was personally reviewed and demonstrates:  as outlined above - EKG shows V paced rhythm with nonspecific changes but not clearly sinus rhythm, query atrial fib/flutter  Telemetry:  Telemetry was personally reviewed and demonstrates:  as outlined above  Relevant CV Studies: 2D echo 10/22/20  1. 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 The myocardium in  this region appears thinned consistent  with prior infarction. No apical  thrombus on contrast imaging. All other LV segments are hypokinetic. EF  severely reduced ~20-25%. Left  ventricular ejection fraction, by estimation, is 20 to 25%. The left  ventricle has severely decreased function. The left ventricle demonstrates  regional wall motion abnormalities (see scoring diagram/findings for  description). There is mild concentric  left ventricular hypertrophy. Indeterminate diastolic filling due to E-A  fusion.   2. Right ventricular systolic function is severely reduced. The right  ventricular size is normal. There is normal pulmonary artery systolic  pressure. The estimated right ventricular systolic pressure is 33.8 mmHg.   3. The mitral valve is grossly normal. Trivial mitral valve  regurgitation. No evidence of mitral stenosis.   4. The aortic valve is grossly normal. Aortic valve regurgitation is not  visualized. No aortic stenosis is present.   5. The inferior vena cava is dilated in size with <50% respiratory  variability, suggesting right atrial pressure of 15 mmHg.   Conclusion(s)/Recommendation(s): Findings consistent with ischemic  cardiomyopathy.   Laboratory Data:  High Sensitivity Troponin:   Recent Labs  Lab 10/21/20 1240 10/21/20 1440  TROPONINIHS 15 16     Chemistry Recent Labs  Lab 10/22/20 0152 10/23/20 0318 10/24/20 0351  NA 140 140 139  K 3.5 3.6 3.4*  CL 101 102 102  CO2 31 30 28   GLUCOSE 137* 115* 117*  BUN 18 18 21   CREATININE 1.08 1.03 1.02  CALCIUM 9.3 9.4 9.2  GFRNONAA >60 >60 >60  ANIONGAP 8 8 9     Recent Labs  Lab 10/21/20 1240 10/22/20 0152  PROT 7.2 6.5  ALBUMIN 4.0 3.7  AST 27 22  ALT 29 25  ALKPHOS 120 110  BILITOT 1.1 1.3*   Hematology Recent Labs  Lab 10/21/20 1240 10/22/20 0152  WBC 5.3 5.0  RBC 4.89 4.65  HGB 13.3 12.6*  HCT 42.9 39.7  MCV 87.7 85.4  MCH 27.2 27.1  MCHC 31.0 31.7  RDW 17.2* 17.1*  PLT 149* 132*   BNP Recent Labs  Lab  10/21/20 1251  BNP 267.2*    DDimer No results for input(s): DDIMER in the last 168 hours.   Radiology/Studies:  DG Chest 2 View  Result Date: 10/21/2020 CLINICAL DATA:  Chest pain EXAM: CHEST - 2 VIEW COMPARISON:  None. FINDINGS: Left chest wall pacer device is noted with leads in the right atrial appendage, coronary sinus and right ventricle. Status post median sternotomy and CABG. Mild cardiac enlargement. No pleural effusion or edema. The visualized skeletal structures are unremarkable. IMPRESSION: No acute cardiopulmonary abnormalities. Cardiac enlargement without signs of CHF. Electronically Signed   By: Signa Kell M.D.   On: 10/21/2020 13:32   ECHOCARDIOGRAM COMPLETE  Result Date: 10/22/2020    ECHOCARDIOGRAM REPORT   Patient Name:   Adair Laundry Date of Exam: 10/22/2020 Medical Rec #:  [id number]    Height:       71.0 in Accession #:    [id number]   Weight:       248.9 lb Date of Birth:  1945/11/18    BSA:          2.314 m Patient Age:    74 years     BP:           139/82 mmHg Patient Gender: M            HR:           72 bpm. Exam Location:  Inpatient Procedure: 2D Echo, Cardiac Doppler, Color Doppler and Intracardiac            Opacification Agent Indications:    R07.9* Chest pain, unspecified  History:        Patient has no prior history of Echocardiogram examinations.                 Pacemaker; Risk Factors:Hypertension and Diabetes.  Sonographer:    Elmarie Shiley Dance Referring Phys: [id number] Cecille Po MELVIN IMPRESSIONS  1. 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 The myocardium in this region appears thinned consistent with prior infarction. No apical thrombus on contrast imaging. All other LV segments are hypokinetic. EF severely reduced ~20-25%. Left ventricular ejection fraction, by estimation, is 20 to 25%. The left ventricle has severely decreased function. The left ventricle demonstrates regional wall motion abnormalities (see scoring diagram/findings for description).  There is mild concentric left ventricular hypertrophy. Indeterminate diastolic filling due to E-A fusion.  2. Right ventricular systolic function is severely reduced. The right ventricular size is normal. There is normal pulmonary artery systolic pressure. The estimated right ventricular systolic pressure is 33.8 mmHg.  3. The mitral valve is grossly normal. Trivial mitral valve regurgitation. No evidence of mitral stenosis.  4. The aortic valve is grossly normal. Aortic valve regurgitation is not visualized. No aortic stenosis is present.  5. The inferior vena cava is dilated in size with <50% respiratory variability, suggesting right atrial pressure of 15 mmHg. Conclusion(s)/Recommendation(s): Findings consistent with ischemic cardiomyopathy. 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 The myocardium in this region appears thinned consistent with prior infarction. No apical thrombus on contrast imaging. All other LV segments are hypokinetic. EF severely reduced ~20-25%. Left ventricular ejection fraction, by estimation, is 20 to 25%. The left ventricle has severely decreased function. The left ventricle demonstrates regional wall motion abnormalities. The left ventricular internal cavity size was normal in size. There is mild concentric left ventricular  hypertrophy. Indeterminate diastolic filling due to E-A fusion.  LV Wall Scoring: The entire apex is akinetic. Right Ventricle: The right ventricular size is normal. No increase in right ventricular wall thickness. Right ventricular systolic function is severely reduced. There is normal pulmonary artery systolic pressure. The tricuspid regurgitant velocity is 2.17 m/s, and with an assumed right atrial pressure of 15 mmHg, the estimated right ventricular systolic pressure is 33.8 mmHg. Left Atrium: Left atrial size was normal in size. Right Atrium: Right atrial size was normal in size. Pericardium: Trivial pericardial effusion is  present. Mitral Valve: The mitral valve is grossly normal. Trivial mitral valve regurgitation. No evidence of mitral valve stenosis. Tricuspid Valve: The tricuspid valve is grossly normal. Tricuspid valve regurgitation is mild . No evidence of tricuspid stenosis. Aortic Valve: The aortic valve is grossly normal. Aortic valve regurgitation is not visualized. No aortic stenosis is present. Pulmonic Valve: The pulmonic valve was grossly normal. Pulmonic valve regurgitation is not visualized. Aorta: The aortic root and ascending aorta are structurally normal, with no evidence of dilitation. Venous: The inferior vena cava is dilated in size with less than 50% respiratory variability, suggesting right atrial pressure of 15 mmHg. IAS/Shunts: The atrial septum is grossly normal. Additional Comments: A device lead is visualized in the right atrium and right ventricle.  LEFT VENTRICLE PLAX 2D LVIDd:         5.30 cm  Diastology LVIDs:         3.60 cm  LV e' medial:    7.83 cm/s LV PW:         1.30 cm  LV E/e' medial:  12.4 LV IVS:        1.30 cm  LV e' lateral:   10.20 cm/s LVOT diam:     2.10 cm  LV E/e' lateral: 9.5 LV SV:         48 LV SV Index:   21 LVOT Area:     3.46 cm  RIGHT VENTRICLE            IVC RV Basal diam:  3.70 cm    IVC diam: 2.55 cm RV Mid diam:    2.80 cm RV S prime:     6.64 cm/s TAPSE (M-mode): 1.6 cm LEFT ATRIUM              Index       RIGHT ATRIUM           Index LA diam:        5.20 cm  2.25 cm/m  RA Area:     25.00 cm LA Vol (A2C):   101.0 ml 43.65 ml/m RA Volume:   82.70 ml  35.74 ml/m LA Vol (A4C):   65.9 ml  28.48 ml/m LA Biplane Vol: 81.4 ml  35.18 ml/m  AORTIC VALVE LVOT Vmax:   69.80 cm/s LVOT Vmean:  48.200 cm/s LVOT VTI:    0.138 m  AORTA Ao Root diam: 3.80 cm Ao Asc diam:  3.70 cm MITRAL VALVE               TRICUSPID VALVE MV Area (PHT): 3.82 cm    TR Peak grad:   18.8 mmHg MV Decel Time: 199 msec    TR Vmax:        217.00 cm/s MV E velocity: 96.75 cm/s  SHUNTS                            Systemic VTI:  0.14 m                            Systemic Diam: 2.10 cm Lennie Odor MD Electronically signed by Lennie Odor MD Signature Date/Time: 10/22/2020/2:57:58 PM    Final      Assessment and Plan:   1. Acute on chronic systolic CHF - improving with diuresis, would continue for now and reassess tomorrow - HF regimen has been advanced as above to include swap from lisinopril to ARB with consideration of Entresto at DC, addition of spironolactone and Jardiance, and continuation of carvedilol - question contribution of arrhythmia - we will interrogate device while admitted  2. History of ICD / possible atrial fib/flutter - per device card, this is a [employer] Jude device last changed out in 12/2018 - denies any recent ICD discharges - per d/w Dr. Antoine Poche, will request formal interrogation in AM to provide further recommendations on whether he has underlying AF - I spoke with Judie Grieve with [employer] to let him know of this request for tomorrow. Judie Grieve will also talk to him about home monitoring equipment needs also - check TSH with labs  3. CAD s/p prior CABG with redo, and PCIs (last in 2016) - patient had some exertional symptoms with volume overload that have improved with diuresis - troponins negative - continue ASA, BB, Plavix, statin - can review prior recommendations for duration of Plavix as outpatient upon obtaining records (these would be challenging to obtain over holiday weekend)  4. Essential HTN - follow in context above  5. Hypokalemia - being repleted by primary team  6. DM - started on SGLT2i   Risk Assessment/Risk Scores:        New York Heart Association (NYHA) Functional Class NYHA Class III on arrival    For questions or updates, please contact [facility] Please consult [email or web address] for contact info under    Signed, Laurann Montana, PA-C  10/24/2020 5:47 PM   History and all data above reviewed.  Patient examined.  I  agree with the findings as above.   The patient has a very complex history as above.  He presents with acute on chronic systolic HF.  He has had about one month of increased edema.  He has been eating about 1/3 of his meals out so increased salt.  He doesn't watch his fluid intake or weigh daily.  He presented with increased swelling and SOB.  No chest pain.  He is not describing PND or orthopnea.  No palpitations.  No fever or chills.   The patient exam reveals COR:RRR  ,  Lungs: Decreased breath sounds with basilar crackles  ,  Abd: Distended, Ext Severe bilateral let edema.  Neck:  JVD 10 cm  .  All available labs, radiology testing, previous records reviewed. Agree with documented assessment and plan.   Acute on chronic systolic HF:   I agree with med changes by our fine hospitalist team.  I agree with plans to change to [facility].  Agree with continuing IV diuresis.  We will get his device interrogated.  From the CXR this is a CRTD.  He has not had this interrogated in awhile as he has been doing a great deal of traveling.  We talked at length about salt,  fluid restriction, daily weights and PRN diuretics.  There is much room to move on his lifestyle.  Also, it is not clear from his surface EKG that he is in NSR.  We will assess with interrogation.    [name]  6:36 PM  10/24/2020

## 2020-10-24 NOTE — Progress Notes (Signed)
Pt is refusing Lovenox Inj. MD paged made aware.

## 2020-10-25 DIAGNOSIS — I4892 Unspecified atrial flutter: Secondary | ICD-10-CM

## 2020-10-25 LAB — BASIC METABOLIC PANEL
Anion gap: 8 (ref 5–15)
BUN: 19 mg/dL (ref 8–23)
CO2: 29 mmol/L (ref 22–32)
Calcium: 9.7 mg/dL (ref 8.9–10.3)
Chloride: 101 mmol/L (ref 98–111)
Creatinine, Ser: 1.13 mg/dL (ref 0.61–1.24)
GFR, Estimated: >60 mL/min (ref >60)
Glucose, Bld: 134 mg/dL — ABNORMAL HIGH (ref 70–99)
Potassium: 3.9 mmol/L (ref 3.5–5.1)
Sodium: 138 mmol/L (ref 135–145)

## 2020-10-25 LAB — GLUCOSE, CAPILLARY
Glucose-Capillary: 133 mg/dL — ABNORMAL HIGH (ref 70–99)
Glucose-Capillary: 121 mg/dL — ABNORMAL HIGH (ref 70–99)
Glucose-Capillary: 184 mg/dL — ABNORMAL HIGH (ref 70–99)

## 2020-10-25 LAB — TSH: TSH: 1.179 u[IU]/mL (ref 0.350–4.500)

## 2020-10-25 MED ORDER — APIXABAN 5 MG PO TABS
5.0000 mg | ORAL_TABLET | Freq: Two times a day (BID) | ORAL | Status: DC
  Administered 2020-10-25 – 2020-10-27 (×5): 5 mg via ORAL
  Filled 2020-10-25 (×5): qty 1

## 2020-10-25 MED ORDER — SACUBITRIL-VALSARTAN 24-26 MG PO TABS: 1.0000 | ORAL_TABLET | Freq: Two times a day (BID) | ORAL | Status: DC

## 2020-10-25 NOTE — Progress Notes (Signed)
ANTICOAGULATION CONSULT NOTE - Initial Consult  Pharmacy Consult for Apixaban Indication: atrial fibrillation  No Known Allergies  Patient Measurements: Height: 5\' 11"  (180.3 cm) Weight: 106.3 kg (234 lb 4.8 oz) IBW/kg (Calculated) : 75.3  Vital Signs: Temp: 97.7 F (36.5 C) (07/03 0403) Temp Source: Oral (07/03 0403) BP: 128/79 (07/03 0403) Pulse Rate: 70 (07/03 0403)  Labs: Recent Labs    10/23/20 0318 10/24/20 0351 10/25/20 0416  CREATININE 1.03 1.02 1.13    Estimated Creatinine Clearance: 71.1 mL/min (by C-G formula based on SCr of 1.13 mg/dL).   Medical History: Past Medical History:  Diagnosis Date   BPH (benign prostatic hyperplasia)    CAD (coronary artery disease)    CABG age 42 and redo 1996, multiple PCIs as well   Chronic systolic CHF (congestive heart failure) (HCC)    DM2 (diabetes mellitus, type 2) (HCC)    Hypertension    ICD (implantable cardioverter-defibrillator) in place    St jude   Obesity    OSA on CPAP     Medications:  Medications Prior to Admission  Medication Sig Dispense Refill Last Dose   aspirin 81 MG EC tablet Take 81 mg by mouth every morning.   10/21/2020 at 0730   carvedilol (COREG) 25 MG tablet Take 25 mg by mouth 2 (two) times daily with a meal.   10/21/2020 at 0730   clopidogrel (PLAVIX) 75 MG tablet Take 75 mg by mouth every morning.   10/21/2020 at 0730   finasteride (PROSCAR) 5 MG tablet Take 5 mg by mouth every evening.   10/20/2020   furosemide (LASIX) 20 MG tablet Take 20 mg by mouth every morning.   10/21/2020   lisinopril (ZESTRIL) 10 MG tablet Take 10 mg by mouth every morning.   10/21/2020   metFORMIN (GLUCOPHAGE-XR) 500 MG 24 hr tablet Take 500 mg by mouth daily with breakfast.   10/21/2020   nitroGLYCERIN (NITROSTAT) 0.4 MG SL tablet Place 0.4 mg under the tongue every 5 (five) minutes as needed for chest pain.   10/20/2020   rosuvastatin (CRESTOR) 40 MG tablet Take 40 mg by mouth at bedtime.   10/20/2020   tamsulosin  (FLOMAX) 0.4 MG CAPS capsule Take 0.4 mg by mouth at bedtime.   10/20/2020    Assessment: 75 yo male presented on 10/21/2020 with chest pain found to have HF exacerbation and newly diagnosed Afib. CHAD2VASC 5. Pharmacy consulted to dose apixaban for afib. Age <80, wt >60kg, Scr <1.5.   Goal of Therapy:  Monitor platelets by anticoagulation protocol: Yes   Plan:  Start apixabban 5mg  twice daily  Pharmacy to provide education   10/23/2020, PharmD Clinical Pharmacist  10/25/2020,10:31 AM

## 2020-10-25 NOTE — Discharge Instructions (Addendum)
Information on my medicine - ELIQUIS® (apixaban) ° °Why was Eliquis® prescribed for you? °Eliquis® was prescribed for you to reduce the risk of a blood clot forming that can cause a stroke if you have a medical condition called atrial fibrillation (a type of irregular heartbeat). ° °What do You need to know about Eliquis® ? °Take your Eliquis® TWICE DAILY - one tablet in the morning and one tablet in the evening with or without food. If you have difficulty swallowing the tablet whole please discuss with your pharmacist how to take the medication safely. ° °Take Eliquis® exactly as prescribed by your doctor and DO NOT stop taking Eliquis® without talking to the doctor who prescribed the medication.  Stopping may increase your risk of developing a stroke.  Refill your prescription before you run out. ° °After discharge, you should have regular check-up appointments with your healthcare provider that is prescribing your Eliquis®.  In the future your dose may need to be changed if your kidney function or weight changes by a significant amount or as you get older. ° °What do you do if you miss a dose? °If you miss a dose, take it as soon as you remember on the same day and resume taking twice daily.  Do not take more than one dose of ELIQUIS at the same time to make up a missed dose. ° °Important Safety Information °A possible side effect of Eliquis® is bleeding. You should call your healthcare provider right away if you experience any of the following: °? Bleeding from an injury or your nose that does not stop. °? Unusual colored urine (red or dark brown) or unusual colored stools (red or black). °? Unusual bruising for unknown reasons. °? A serious fall or if you hit your head (even if there is no bleeding). ° °Some medicines may interact with Eliquis® and might increase your risk of bleeding or clotting while on Eliquis®. To help avoid this, consult your healthcare provider or pharmacist prior to using any new  prescription or non-prescription medications, including herbals, vitamins, non-steroidal anti-inflammatory drugs (NSAIDs) and supplements. ° °This website has more information on Eliquis® (apixaban): [email or web address].[email or web address] ° °Heart Healthy, Consistent Carbohydrate Nutrition Therapy  ° °A heart-healthy and consistent carbohydrate diet is recommended to manage heart disease and diabetes. °To follow a heart-healthy and consistent carbohydrate diet, °• Eat a balanced diet with whole grains, fruits and vegetables, and lean protein sources.  °• Choose heart-healthy unsaturated fats. Limit saturated fats, trans fats, and cholesterol intake. Eat more plant-based or vegetarian meals using beans and soy foods for protein.  °• Eat whole, unprocessed foods to limit the amount of sodium (salt) you eat.  °• Choose a consistent amount of carbohydrate at each meal and snack. Limit refined carbohydrates especially sugar, sweets and sugar-sweetened beverages.  °• If you drink alcohol, do so in moderation: one serving per day (women) and two servings per day (men). °o One serving is equivalent to 12 ounces beer, 5 ounces wine, or 1.5 ounces distilled spirits ° °Tips °Tips for Choosing Heart-Healthy Fats °Choose lean protein and low-fat dairy foods to reduce saturated fat intake. °• Saturated fat is usually found in animal-based protein and is associated with certain health risks. Saturated fat is the biggest contributor to raise low-density lipoprotein (LDL) cholesterol levels. Research shows that limiting saturated fat lowers unhealthy cholesterol levels. Eat no more than 7% of your total calories each day from saturated fat. Ask your RDN to help you determine how much saturated fat   is right for you. °• There are many foods that do not contain large amounts of saturated fats. Swapping these foods to replace foods high in saturated fats will help you limit the saturated fat you eat and improve your cholesterol levels. You  can also try eating more plant-based or vegetarian meals. °Instead of… Try:  °Whole milk, cheese, yogurt, and ice cream 1% or skim milk, low-fat cheese, non-fat yogurt, and low-fat ice cream  °Fatty, marbled beef and pork Lean beef, pork, or venison  °Poultry with skin Poultry without skin  °Butter, stick margarine Reduced-fat, whipped, or liquid spreads  °Coconut oil, palm oil Liquid vegetable oils: corn, canola, olive, soybean and safflower oils  ° °Avoid foods that contain trans fats. °• Trans fats increase levels of LDL-cholesterol. Hydrogenated fat in processed foods is the main source of trans fats in foods.  °• Trans fats can be found in stick margarine, shortening, processed sweets, baked goods, some fried foods, and packaged foods made with hydrogenated oils. Avoid foods with “partially hydrogenated oil” on the ingredient list such as: cookies, pastries, baked goods, biscuits, crackers, microwave popcorn, and frozen dinners. °Choose foods with heart healthy fats. °• Polyunsaturated and monounsaturated fat are unsaturated fats that may help lower your blood cholesterol level when used in place of saturated fat in your diet. °• Ask your RDN about taking a dietary supplement with plant sterols and stanols to help lower your cholesterol level. °• Research shows that substituting saturated fats with unsaturated fats is beneficial to cholesterol levels. Try these easy swaps: °Instead of… Try:  °Butter, stick margarine, or solid shortening Reduced-fat, whipped, or liquid spreads  °Beef, pork, or poultry with skin Fish and seafood  °Chips, crackers, snack foods Raw or unsalted nuts and seeds or nut butters °Hummus with vegetables °Avocado on toast  °Coconut oil, palm oil Liquid vegetable oils: corn, canola, olive, soybean and safflower oils  °Limit the amount of cholesterol you eat to less than 200 milligrams per day. °• Cholesterol is a substance carried through the bloodstream via lipoproteins, which are known as  “transporters” of fat. Some body functions need cholesterol to work properly, but too much cholesterol in the bloodstream can damage arteries and build up blood vessel linings (which can lead to heart attack and stroke). You should eat less than 200 milligrams cholesterol per day. °• People respond differently to eating cholesterol. There is no test available right now that can figure out which people will respond more to dietary cholesterol and which will respond less. For individuals with high intake of dietary cholesterol, different types of increase (none, small, moderate, large) in LDL-cholesterol levels are all possible.  °• Food sources of cholesterol include egg yolks and organ meats such as liver, gizzards. Limit egg yolks to two to four per week and avoid organ meats like liver and gizzards to control cholesterol intake. °Tips for Choosing Heart-Healthy Carbohydrates °Consume a consistent amount of carbohydrate °• It is important to eat foods with carbohydrates in moderation because they impact your blood glucose level. Carbohydrates can be found in many foods such as: °• Grains (breads, crackers, rice, pasta, and cereals)  °• Starchy Vegetables (potatoes, corn, and peas)  °• Beans and legumes  °• Milk, soy milk, and yogurt  °• Fruit and fruit juice  °• Sweets (cakes, cookies, ice cream, jam and jelly) °• Your RDN will help you set a goal for how many carbohydrate servings to eat at your meals and snacks. For many adults, eating 3   to 5 servings of carbohydrate foods at each meal and 1 or 2 carbohydrate servings for each snack works well.  °• Check your blood glucose level regularly. It can tell you if you need to adjust when you eat carbohydrates. °• Choose foods rich in viscous (soluble) fiber °• Viscous, or soluble, is found in the walls of plant cells. Viscous fiber is found only in plant-based foods. Eating foods with fiber helps to lower your unhealthy cholesterol and keep your blood glucose in range   °• Rich sources of viscous fiber include vegetables (asparagus, Brussels sprouts, sweet potatoes, turnips) fruit (apricots, mangoes, oranges), legumes, and whole grains (barley, oats, and oat bran).  °• As you increase your fiber intake gradually, also increase the amount of water you drink. This will help prevent constipation.  °• If you have difficulty achieving this goal, ask your RDN about fiber laxatives. Choose fiber supplements made with viscous fibers such as psyllium seed husks or methylcellulose to help lower unhealthy cholesterol.  °• Limit refined carbohydrates  °• There are three types of carbohydrates: starches, sugar, and fiber. Some carbohydrates occur naturally in food, like the starches in rice or corn or the sugars in fruits and milk. Refined carbohydrates--foods with high amounts of simple sugars--can raise triglyceride levels. High triglyceride levels are associated with coronary heart disease. °• Some examples of refined carbohydrate foods are table sugar, sweets, and beverages sweetened with added sugar. °Tips for Reducing Sodium (Salt) °Although sodium is important for your body to function, too much sodium can be harmful for people with high blood pressure. As sodium and fluid buildup in your tissues and bloodstream, your blood pressure increases. High blood pressure may cause damage to other organs and increase your risk for a stroke. °Even if you take a pill for blood pressure or a water pill (diuretic) to remove fluid, it is still important to have less salt in your diet. Ask your doctor and RDN what amount of sodium is right for you. °• Avoid processed foods. Eat more fresh foods.  °• Fresh fruits and vegetables are naturally low in sodium, as well as frozen vegetables and fruits that have no added juices or sauces.  °• Fresh meats are lower in sodium than processed meats, such as bacon, sausage, and hotdogs. Read the nutrition label or ask your butcher to help you find a fresh meat  that is low in sodium. °• Eat less salt--at the table and when cooking.  °• A single teaspoon of table salt has 2,300 mg of sodium.  °• Leave the salt out of recipes for pasta, casseroles, and soups.  °• Ask your RDN how to cook your favorite recipes without sodium °• Be a smart shopper.  °• Look for food packages that say “salt-free” or “sodium-free.” These items contain less than 5 milligrams of sodium per serving.  °• “Very low-sodium” products contain less than 35 milligrams of sodium per serving.  °• “Low-sodium” products contain less than 140 milligrams of sodium per serving.  °• Beware for “Unsalted” or “No Added Salt” products. These items may still be high in sodium. Check the nutrition label. °• Add flavors to your food without adding sodium.  °• Try lemon juice, lime juice, fruit juice or vinegar.  °• Dry or fresh herbs add flavor. Try basil, bay leaf, dill, rosemary, parsley, sage, dry mustard, nutmeg, thyme, and paprika.  °• Pepper, red pepper flakes, and cayenne pepper can add spice t your meals without adding sodium. Hot sauce contains   sodium, but if you use just a drop or two, it will not add up to much.  °• Buy a sodium-free seasoning blend or make your own at home. °Additional Lifestyle Tips °Achieve and maintain a healthy weight. °• Talk with your RDN or your doctor about what is a healthy weight for you. °• Set goals to reach and maintain that weight.  °• To lose weight, reduce your calorie intake along with increasing your physical activity. A weight loss of 10 to 15 pounds could reduce LDL-cholesterol by 5 milligrams per deciliter. °Participate in physical activity. °• Talk with your health care team to find out what types of physical activity are best for you. Set a plan to get about 30 minutes of exercise on most days. ° °Foods Recommended °Food Group Foods Recommended  °Grains Whole grain breads and cereals, including whole wheat, barley, rye, buckwheat, corn, teff, quinoa, millet, amaranth,  brown or wild rice, sorghum, and oats °Pasta, especially whole wheat or other whole grain types  °Brown rice, quinoa or wild rice °Whole grain crackers, bread, rolls, pitas °Home-made bread with reduced-sodium baking soda  °Protein Foods Lean cuts of beef and pork (loin, leg, round, extra lean hamburger)  °Skinless poultry °Fish °Venison and other wild game °Dried beans and peas °Nuts and nut butters °Meat alternatives made with soy or textured vegetable protein  °Egg whites or egg substitute °Cold cuts made with lean meat or soy protein  °Dairy Nonfat (skim), low-fat, or 1%-fat milk  °Nonfat or low-fat yogurt or cottage cheese °Fat-free and low-fat cheese  °Vegetables Fresh, frozen, or canned vegetables without added fat or salt   °Fruits Fresh, frozen, canned, or dried fruit   °Oils Unsaturated oils (corn, olive, peanut, soy, sunflower, canola)  °Soft or liquid margarines and vegetable oil spreads  °Salad dressings °Seeds and nuts  °Avocado  ° °Foods Not Recommended °Food Group Foods Not Recommended  °Grains Breads or crackers topped with salt °Cereals (hot or cold) with more than 300 mg sodium per serving °Biscuits, cornbread, and other “quick” breads prepared with baking soda °Bread crumbs or stuffing mix from a store °High-fat bakery products, such as doughnuts, biscuits, croissants, danish pastries, pies, cookies °Instant cooking foods to which you add hot water and stir--potatoes, noodles, rice, etc. °Packaged starchy foods--seasoned noodle or rice dishes, stuffing mix, macaroni and cheese dinner °Snacks made with partially hydrogenated oils, including chips, cheese puffs, snack mixes, regular crackers, butter-flavored popcorn  °Protein Foods Higher-fat cuts of meats (ribs, t-bone steak, regular hamburger) °Bacon, sausage, or hot dogs °Cold cuts, such as salami or bologna, deli meats, cured meats, corned beef °Organ meats (liver, brains, gizzards, sweetbreads) °Poultry with skin °Fried or smoked meat,  poultry, and fish °Whole eggs and egg yolks (more than 2-4 per week) °Salted legumes, nuts, seeds, or nut/seed butters °Meat alternatives with high levels of sodium (>300 mg per serving) or saturated fat (>5 g per serving)  °Dairy Whole milk,?2% fat milk, buttermilk °Whole milk yogurt or ice cream °Cream °Half-&-half °Cream cheese °Sour cream °Cheese  °Vegetables Canned or frozen vegetables with salt, fresh vegetables prepared with salt, butter, cheese, or cream sauce °Fried vegetables °Pickled vegetables such as olives, pickles, or sauerkraut  °Fruits Fried fruits °Fruits served with butter or cream  °Oils Butter, stick margarine, shortening °Partially hydrogenated oils or trans fats °Tropical oils (coconut, palm, palm kernel oils)  °Other Candy, sugar sweetened soft drinks and desserts °Salt, sea salt, garlic salt, and seasoning mixes containing salt °Bouillon cubes °  Ketchup, barbecue sauce, Worcestershire sauce, soy sauce, teriyaki sauce °Miso °Salsa °Pickles, olives, relish  ° °Heart Healthy Consistent Carbohydrate Vegetarian (Lacto-Ovo) Sample 1-Day Menu  °Breakfast 1 cup oatmeal, cooked (2 carbohydrate servings)  °¾ cup blueberries (1 carbohydrate serving)  °11 almonds, without salt  °1 cup 1% milk (1 carbohydrate serving)  °1 cup coffee  °Morning Snack 1 cup fat-free plain yogurt (1 carbohydrate serving)  °Lunch 1 whole wheat bun (1½ carbohydrate servings)  °1 black bean burger (1 carbohydrate servings)  °1 slice cheddar cheese, low sodium  °2 slices tomatoes  °2 leaves lettuce  °1 teaspoon mustard  °1 small pear (1½ carbohydrate servings)  °1 cup green tea, unsweetened  °Afternoon Snack 1/3 cup trail mix with nuts, seeds, and raisins, without salt (1½ carbohydrate servinga)  °Evening Meal ½ cup meatless chicken  °2/3 cup brown rice, cooked (2 carbohydrate servings)  °1 cup broccoli, cooked (2/3 carbohydrate serving)  °½ cup carrots, cooked (1/3 carbohydrate serving)  °2 teaspoons olive oil  °1 teaspoon  balsamic vinegar  °1 whole wheat dinner roll (1 carbohydrate serving)  °1 teaspoon margarine, soft, tub  °1 cup 1% milk (1 carbohydrate serving)  °Evening Snack 1 extra small banana (1 carbohydrate serving)  °1 tablespoon peanut butter  ° °Heart Healthy Consistent Carbohydrate Vegan Sample 1-Day Menu  °Breakfast 1 cup oatmeal, cooked (2 carbohydrate servings)  °¾ cup blueberries (1 carbohydrate serving)  °11 almonds, without salt  °1 cup soymilk fortified with calcium, vitamin B12, and vitamin D  °1 cup coffee  °Morning Snack 6 ounces soy yogurt (1½ carbohydrate servings)  °Lunch 1 whole wheat bun(1½ carbohydrate servings)  °1 black bean burger (1 carbohydrate serving)  °2 slices tomatoes  °2 leaves lettuce  °1 teaspoon mustard  °1 small pear (1½ carbohydrate servings)  °1 cup green tea, unsweetened  °Afternoon Snack 1/3 cup trail mix with nuts, seeds, and raisins, without salt (1½ carbohydrate servings)  °Evening Meal ½ cup meatless chicken  °2/3 cup brown rice, cooked (2 carbohydrate servings)  °1 cup broccoli, cooked (2/3 carbohydrate serving)  °½ cup carrots, cooked (1/3 carbohydrate serving)  °2 teaspoons olive oil  °1 teaspoon balsamic vinegar  °1 whole wheat dinner roll (1 carbohydrate serving)  °1 teaspoon margarine, soft, tub  °1 cup soymilk fortified with calcium, vitamin B12, and vitamin D  °Evening Snack 1 extra small banana (1 carbohydrate serving)  °1 tablespoon peanut butter   ° °Heart Healthy Consistent Carbohydrate Sample 1-Day Menu  °Breakfast 1 cup cooked oatmeal (2 carbohydrate servings)  °3/4 cup blueberries (1 carbohydrate serving)  °1 ounce almonds  °1 cup skim milk (1 carbohydrate serving)  °1 cup coffee  °Morning Snack 1 cup sugar-free nonfat yogurt (1 carbohydrate serving)  °Lunch 2 slices whole-wheat bread (2 carbohydrate servings)  °2 ounces lean turkey breast  °1 ounce low-fat Swiss cheese  °1 teaspoon mustard  °1 slice tomato  °1 lettuce leaf  °1 small pear (1 carbohydrate serving)  °1  cup skim milk (1 carbohydrate serving)  °Afternoon Snack 1 ounce trail mix with unsalted nuts, seeds, and raisins (1 carbohydrate serving)  °Evening Meal 3 ounces salmon  °2/3 cup cooked brown rice (2 carbohydrate servings)  °1 teaspoon soft margarine  °1 cup cooked broccoli with 1/2 cup cooked carrots (1 carbohydrate serving  °Carrots, cooked, boiled, drained, without salt  °1 cup lettuce  °1 teaspoon olive oil with vinegar for dressing  °1 small whole grain roll (1 carbohydrate serving)  °1 teaspoon soft margarine  °  1 cup unsweetened tea  °Evening Snack 1 extra-small banana (1 carbohydrate serving)  °Copyright 2020 © [employer]. All rights reserved. °

## 2020-10-25 NOTE — Progress Notes (Signed)
Progress Note  Patient Name: Chad Hahn Date of Encounter: 10/25/2020  [facility] Cardiologist: None   Subjective   Doing well this morning. States his edema and SOB are improving significantly.   Inpatient Medications    Scheduled Meds:  aspirin EC  81 mg Oral Daily   carvedilol  25 mg Oral BID WC   clopidogrel  75 mg Oral Daily   empagliflozin  10 mg Oral Daily   enoxaparin (LOVENOX) injection  40 mg Subcutaneous Daily   finasteride  5 mg Oral QHS   furosemide  60 mg Intravenous BID   irbesartan  37.5 mg Oral Daily   rosuvastatin  40 mg Oral QHS   sodium chloride flush  3 mL Intravenous Q12H   spironolactone  25 mg Oral Daily   tamsulosin  0.4 mg Oral Daily   Continuous Infusions:  PRN Meds: acetaminophen **OR** acetaminophen, nitroGLYCERIN, polyethylene glycol   Vital Signs    Vitals:   10/24/20 0411 10/24/20 0800 10/24/20 1951 10/25/20 0403  BP: 124/75 101/63 107/69 128/79  Pulse: 70 70 70 70  Resp: 18  18 18   Temp: 97.9 F (36.6 C) 97.8 F (36.6 C) 97.8 F (36.6 C) 97.7 F (36.5 C)  TempSrc: Oral Oral Oral Oral  SpO2: 93% 96% 96% 96%  Weight: 108 kg   106.3 kg  Height:        Intake/Output Summary (Last 24 hours) at 10/25/2020 0948 Last data filed at 10/25/2020 0752 Gross per 24 hour  Intake 1043 ml  Output 3545 ml  Net -2502 ml   Last 3 Weights 10/25/2020 10/24/2020 10/23/2020  Weight (lbs) 234 lb 4.8 oz 238 lb 244 lb 9.6 oz  Weight (kg) 106.278 kg 107.956 kg 110.95 kg      Telemetry    V-paced with underlying atrial flutter - Personally Reviewed  ECG    No new tracing - Personally Reviewed  Physical Exam   GEN: No acute distress.  Sitting up in a chair Neck: No JVD Cardiac: RR, no murmurs Respiratory: Faint crackles at bases. Otherwise clear GI: Soft, nontender, non-distended  MS: 1+ pitting edema to mid shins, chronic venous stasis changes Neuro:  Nonfocal  Psych: Normal affect   Labs    High Sensitivity Troponin:   Recent Labs   Lab 10/21/20 1240 10/21/20 1440  TROPONINIHS 15 16      Chemistry Recent Labs  Lab 10/21/20 1240 10/22/20 0152 10/23/20 0318 10/24/20 0351 10/25/20 0416  NA 139 140 140 139 138  K 3.6 3.5 3.6 3.4* 3.9  CL 102 101 102 102 101  CO2 27 31 30 28 29   GLUCOSE 111* 137* 115* 117* 134*  BUN 18 18 18 21 19   CREATININE 0.91 1.08 1.03 1.02 1.13  CALCIUM 9.6 9.3 9.4 9.2 9.7  PROT 7.2 6.5  --   --   --   ALBUMIN 4.0 3.7  --   --   --   AST 27 22  --   --   --   ALT 29 25  --   --   --   ALKPHOS 120 110  --   --   --   BILITOT 1.1 1.3*  --   --   --   GFRNONAA >60 >60 >60 >60 >60  ANIONGAP 10 8 8 9 8      Hematology Recent Labs  Lab 10/21/20 1240 10/22/20 0152  WBC 5.3 5.0  RBC 4.89 4.65  HGB 13.3 12.6*  HCT 42.9  39.7  MCV 87.7 85.4  MCH 27.2 27.1  MCHC 31.0 31.7  RDW 17.2* 17.1*  PLT 149* 132*    BNP Recent Labs  Lab 10/21/20 1251  BNP 267.2*     DDimer No results for input(s): DDIMER in the last 168 hours.   Radiology    No results found.  Cardiac Studies   TTE 10/22/20  1. 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 The myocardium in  this region appears thinned consistent with prior infarction. No apical  thrombus on contrast imaging. All other LV segments are hypokinetic. EF  severely reduced ~20-25%. Left  ventricular ejection fraction, by estimation, is 20 to 25%. The left  ventricle has severely decreased function. The left ventricle demonstrates  regional wall motion abnormalities (see scoring diagram/findings for  description). There is mild concentric  left ventricular hypertrophy. Indeterminate diastolic filling due to E-A  fusion.   2. Right ventricular systolic function is severely reduced. The right  ventricular size is normal. There is normal pulmonary artery systolic  pressure. The estimated right ventricular systolic pressure is 33.8 mmHg.   3. The mitral valve is grossly normal. Trivial mitral valve  regurgitation. No evidence  of mitral stenosis.   4. The aortic valve is grossly normal. Aortic valve regurgitation is not  visualized. No aortic stenosis is present.   5. The inferior vena cava is dilated in size with <50% respiratory  variability, suggesting right atrial pressure of 15 mmHg.   Conclusion(s)/Recommendation(s): Findings consistent with ischemic  cardiomyopathy.  Patient Profile     75 y.o. male with a hx of chronic systolic CHF, [employer] ICD 2013 with generator change 12/2018, CAD s/p CABG in 1990 with redo in 1996 and PCIs, DM, HTN, OSA on CPAP, BPH who presented with worsening SOB, orthopnea and chest pressure found to have acute on chronic systolic HF exacerbation as well as newly diagnosed atrial flutter for which Cardiology has been consulted.   Assessment & Plan    #Acute on chronic systolic heart failure exacerbation: Patient with known likely ischemic cardiomyopathy s/p ICD placement previously followed in [place]. TTE on 10/22/20 with EF 20-25%, akinetic/aneurysmal apex, mild LVH, dilated IVC. Volume status improving with diuresis. -Continue lasix 60mg  IV BID; likely transition to PO in the next couple of days -Continue coreg 25mg  BID -Continue jardiance 10mg  daily -Stop irbesartan and start entresto 24/26mg  BID tomorrow  -Continue spironolactone 25mg  daily -Monitor I/Os and daily weights -Low Na diet  #Newly Diagnosed Atrial Flutter: CHADs-vasc 5. Has been in aflutter since 06/2020. Newly diagnosed but not detected as patient has not been sending in remote device checks as he left his monitoring equipment in [place].  -Start apixaban 5mg  BID for anticoagulation -Continue coreg 25mg  BID -Plan for DCCV after 3 weeks of AC  #CAD s/p CABG in 1990 with re-do 1996 and subsequent PCIs: Likely acute presentation due to HF exacerbation as detailed above with low suspicion of worsening ischemia.  -Stop ASA -Continue plavix 75mg  daily -Continue crestor 40mg  daily -Continue coreg 25mg   BID  #HTN: -Continue coreg 25mg  BID -Continue spironolactone 25mg  daily -Stop irbesartan and start entresto 24/26mg  BID tomorrow   #DMII: -Started jardiance 10mg  as above    For questions or updates, please contact [facility] Please consult [email or web address] for contact info under        Signed, , MD  10/25/2020, 9:48 AM

## 2020-10-25 NOTE — Progress Notes (Addendum)
PROGRESS NOTE    Chad Hahn  ZOX:[id number] DOB: 06/12/1945 DOA: 10/21/2020 PCP: System, Provider Not In    Brief Narrative:  Chad Hahn was admitted to the hospital with the working diagnosis of acute on chronic systolic heart failure decompensation.    75 year old male, Chad Hahn with  past medical history for heart failure, hypertension, coronary artery disease status post bypass grafting, type 2 diabetes mellitus and obstructive sleep apnea.  Reported few weeks of worsening dyspnea, and lower extremity edema, associated with chest pressure, no improving worsening factors.  Currently visiting his daughter from [place].  07/2020 hospitalization in [place] for cholecystitis status postcholecystectomy, and then he had exacerbation of his heart failure, ejection fraction 30 to 40%, at home taking furosemide. On his initial physical examination blood pressure 146/87, heart rate 70, respiratory rate 22, oxygen saturation 98% on supplemental oxygen.  His lungs had no wheezing or rales, heart S1-S2, present, rhythmic, soft abdomen, positive bilateral lower extremity edema.   Sodium 139, potassium 3.6, chloride 102, bicarb 27, glucose 111, BUN 18, creatinine 0.91, BNP 267, high sensitive troponin 15-16, white count 5.3, hemoglobin 13.3, hematocrit 42.9, platelets 149. SARS COVID-19 negative.   Urinalysis specific gravity 1.006.   Chest radiograph with cardiomegaly, positive hilar vascular congestion.   EKG 70 bpm, rightward axis, QTC 542, prolonged QRS, a sensed, V paced, no significant ST segment or T wave changes.   Patient placed on aggressive diuresis with good toleration.   Echocardiogram with worsening biventricular failure.    Heart failure guideline therapy in place and adjusting medical regimen. Per echocardiography worsening LV systolic function, in the setting of advanced, can not rule out recent ischemic event. Consult cardiology for possible ischemic re-evaluation and heart  failure team follow up.    Cardiology has been consulted, patient has paroxysmal atrial flutter, per device interrogation. Started on apixaban for anticoagulation.    Assessment & Plan:   Principal Problem:   Acute on chronic systolic CHF (congestive heart failure) (HCC) Active Problems:   Essential hypertension   Ischemic congestive cardiomyopathy (HCC)   Obstructive sleep apnea syndrome   Chest pain, rule out acute myocardial infarction   Diabetes (HCC)   CAD (coronary artery disease)   CHF (congestive heart failure) (HCC)   Acute on chronic systolic heart failure exacerbation, biventricular failure (LV EF 20 to 25%).  Ischemic cardiomyopathy. Echocardiogram with further reduction in LV systolic function 20 to 25%, entire apex is akinetic and possible aneurysmal. Severe LV dilatation. RV systolic function severely reduced. RSVP 33,8 mmHg.   Continue to improve his lower extremity edema.  Urine output 3,500 ml over last 24 hrs, stable blood pressure with systolic 119 mmHg.    Tolerating well diuresis with furosemide with 60 mg IV q12.  Heart failure management with spironolactone, empagliflozin. Transition to [facility] today.   Consult PT/OT and Nutrition (heart failure diet teaching).   Reduction in EF likely related to medical and dietary non compliance, and underlying atrial flutter.    2. New diagnosed atrial flutter. Currently reate control, telemetry personally reviewed, V paced rhythm.  Continue anticoagulation with apixaban.  Follow as outpatient for possible cardioversion.    2. CAD/ dyslipidemia. Sp CABG in the past 1980 and 1990.  Patient had several coronary angioplasties 2014 and 2016, last cardiac catheterization in 2018.  No clinical evidence of ongoing ischemic.   Continue medical therapy with clopidogrel, atorvastatin, b blocker and ARB.  Discontinue aspirin now that patient is on full anticoagulation with apixaban,    3.  HTN. Continue carvedilol and  entresto.     4. T2DM. continue with empagliflozin. For glucose control.    5. BPH. On tamsulosin and finesteride    6. Hypokalemia. Renal function continue to be stable, with serum cr at 1,13 with K at 3,9 and serum bicarbonate at 29. Continue close follow up on renal function and electrolytes.    7. Obesity class 1/ OSA . Calculated BMI is 34.1  Cpap at night. .    Patient continue to be at high risk for worsening heart failure   Status is: Inpatient  Remains inpatient appropriate because:Inpatient level of care appropriate due to severity of illness  Dispo: The patient is from: Home              Anticipated d/c is to: Home              Patient currently is not medically stable to d/c.   Difficult to place patient No   DVT prophylaxis: Apixaban   Code Status:   full  Family Communication:   I was not able to contact his daughter, not able to leave messages on the number listed.     Consultants:  Cardiology    Subjective: Patient with improvement in dyspnea and lower extremity edema, no chest pain, no nausea or vomiting.   Objective: Vitals:   10/24/20 0411 10/24/20 0800 10/24/20 1951 10/25/20 0403  BP: 124/75 101/63 107/69 128/79  Pulse: 70 70 70 70  Resp: 18  18 18   Temp: 97.9 F (36.6 C) 97.8 F (36.6 C) 97.8 F (36.6 C) 97.7 F (36.5 C)  TempSrc: Oral Oral Oral Oral  SpO2: 93% 96% 96% 96%  Weight: 108 kg   106.3 kg  Height:        Intake/Output Summary (Last 24 hours) at 10/25/2020 1038 Last data filed at 10/25/2020 0956 Gross per 24 hour  Intake 1043 ml  Output 4245 ml  Net -3202 ml   Filed Weights   10/23/20 0324 10/24/20 0411 10/25/20 0403  Weight: 110.9 kg 108 kg 106.3 kg    Examination:   General: Not in pain or dyspnea,  Neurology: Awake and alert, non focal  E ENT: mild pallor, no icterus, oral mucosa moist Cardiovascular: No JVD. S1-S2 present, rhythmic, no gallops, rubs, or murmurs. ++ pitting bilateral lower extremity  edema. Pulmonary: positive breath sounds bilaterally, with no wheezing, rhonchi or rales. Gastrointestinal. Abdomen soft and non tender Skin. No rashes Musculoskeletal: no joint deformities     Data Reviewed: I have personally reviewed following labs and imaging studies  CBC: Recent Labs  Lab 10/21/20 1240 10/22/20 0152  WBC 5.3 5.0  HGB 13.3 12.6*  HCT 42.9 39.7  MCV 87.7 85.4  PLT 149* 132*   Basic Metabolic Panel: Recent Labs  Lab 10/21/20 1240 10/21/20 2232 10/22/20 0152 10/23/20 0318 10/24/20 0351 10/25/20 0416  NA 139  --  140 140 139 138  K 3.6  --  3.5 3.6 3.4* 3.9  CL 102  --  101 102 102 101  CO2 27  --  31 30 28 29   GLUCOSE 111*  --  137* 115* 117* 134*  BUN 18  --  18 18 21 19   CREATININE 0.91  --  1.08 1.03 1.02 1.13  CALCIUM 9.6  --  9.3 9.4 9.2 9.7  MG  --  2.3  --   --  2.1  --    GFR: Estimated Creatinine Clearance: 71.1 mL/min (by C-G formula based  on SCr of 1.13 mg/dL). Liver Function Tests: Recent Labs  Lab 10/21/20 1240 10/22/20 0152  AST 27 22  ALT 29 25  ALKPHOS 120 110  BILITOT 1.1 1.3*  PROT 7.2 6.5  ALBUMIN 4.0 3.7   Recent Labs  Lab 10/21/20 1240  LIPASE 45   No results for input(s): AMMONIA in the last 168 hours. Coagulation Profile: No results for input(s): INR, PROTIME in the last 168 hours. Cardiac Enzymes: No results for input(s): CKTOTAL, CKMB, CKMBINDEX, TROPONINI in the last 168 hours. BNP (last 3 results) No results for input(s): PROBNP in the last 8760 hours. HbA1C: No results for input(s): HGBA1C in the last 72 hours. CBG: Recent Labs  Lab 10/24/20 0537 10/24/20 1151 10/24/20 1551 10/24/20 2122 10/25/20 0542  GLUCAP 123* 111* 115* 152* 133*   Lipid Profile: No results for input(s): CHOL, HDL, LDLCALC, TRIG, CHOLHDL, LDLDIRECT in the last 72 hours. Thyroid Function Tests: Recent Labs    10/25/20 0416  TSH 1.179   Anemia Panel: No results for input(s): VITAMINB12, FOLATE, FERRITIN, TIBC, IRON,  RETICCTPCT in the last 72 hours.    Radiology Studies: I have reviewed all of the imaging during this hospital visit personally     Scheduled Meds:  apixaban  5 mg Oral BID   carvedilol  25 mg Oral BID WC   clopidogrel  75 mg Oral Daily   empagliflozin  10 mg Oral Daily   finasteride  5 mg Oral QHS   furosemide  60 mg Intravenous BID   rosuvastatin  40 mg Oral QHS   [START ON 10/26/2020] sacubitril-valsartan  1 tablet Oral BID   sodium chloride flush  3 mL Intravenous Q12H   spironolactone  25 mg Oral Daily   tamsulosin  0.4 mg Oral Daily   Continuous Infusions:   LOS: 3 days        Trace Cederberg Annett Gula, MD

## 2020-10-25 NOTE — Progress Notes (Signed)
Received sign out from [place], Satira Sark [employer] rep regarding device interrogation. Patient has been in atrial flutter since March 2022, with uptrending volume status correlating with arrhythmia. Rates have been well controlled. Device function is otherwise normal without any recent ICD shocks. His impedence appears to be improving with diuresis per the last few days of information. The device had not been interrogated since January of 2021. Judie Grieve did some education about the need for home monitoring but the patient's monitoring equipment has been back in [place] so therefore not recently transmitting to his cardiologist (hence why atrial flutter went undetected, despite typically flagging for an alert). Per [employer] conversation with the patient, it is not yet clear where he will settle so for now, no changes were made to the way his device transmits information - when he gets the home transmitter back, it will still transmit to his regular cardiologist that he was seeing before relocation. Will make Dr. Antoine Poche aware so that question of anticoag / rhythm strategy can be reviewed this AM. Ronie Spies PA-C

## 2020-10-26 DIAGNOSIS — E11319 Type 2 diabetes mellitus with unspecified diabetic retinopathy without macular edema: Secondary | ICD-10-CM

## 2020-10-26 LAB — BASIC METABOLIC PANEL
Anion gap: 9 (ref 5–15)
BUN: 25 mg/dL — ABNORMAL HIGH (ref 8–23)
CO2: 28 mmol/L (ref 22–32)
Calcium: 9.5 mg/dL (ref 8.9–10.3)
Chloride: 101 mmol/L (ref 98–111)
Creatinine, Ser: 1.28 mg/dL — ABNORMAL HIGH (ref 0.61–1.24)
GFR, Estimated: 59 mL/min — ABNORMAL LOW (ref >60)
Glucose, Bld: 119 mg/dL — ABNORMAL HIGH (ref 70–99)
Potassium: 3.4 mmol/L — ABNORMAL LOW (ref 3.5–5.1)
Sodium: 138 mmol/L (ref 135–145)

## 2020-10-26 LAB — MAGNESIUM: Magnesium: 2.4 mg/dL (ref 1.7–2.4)

## 2020-10-26 LAB — GLUCOSE, CAPILLARY
Glucose-Capillary: 147 mg/dL — ABNORMAL HIGH (ref 70–99)
Glucose-Capillary: 181 mg/dL — ABNORMAL HIGH (ref 70–99)
Glucose-Capillary: 119 mg/dL — ABNORMAL HIGH (ref 70–99)
Glucose-Capillary: 140 mg/dL — ABNORMAL HIGH (ref 70–99)

## 2020-10-26 MED ORDER — SACUBITRIL-VALSARTAN 24-26 MG PO TABS
1.0000 | ORAL_TABLET | Freq: Two times a day (BID) | ORAL | Status: DC
  Administered 2020-10-27: 1 via ORAL
  Filled 2020-10-26: qty 1

## 2020-10-26 MED ORDER — POTASSIUM CHLORIDE CRYS ER 20 MEQ PO TBCR
40.0000 meq | EXTENDED_RELEASE_TABLET | Freq: Once | ORAL | Status: AC
  Administered 2020-10-26: 40 meq via ORAL
  Filled 2020-10-26: qty 2

## 2020-10-26 MED ORDER — FUROSEMIDE 40 MG PO TABS
60.0000 mg | ORAL_TABLET | Freq: Every day | ORAL | Status: DC
  Administered 2020-10-26 – 2020-10-27 (×2): 60 mg via ORAL
  Filled 2020-10-26 (×2): qty 1

## 2020-10-26 NOTE — Progress Notes (Signed)
OT Cancellation Note  Patient Details Name: Horace Lukas MRN: [id number] DOB: 09/16/1945   Cancelled Treatment:    Reason Eval/Treat Not Completed: OT screened, no needs identified, will sign off Noted new OT orders. Pt was evaluated by OT on 7/1 with no further needs identified. Pt observed ambulating on unit independently with no functional status changes noted. No current OT needs identified. OT to sign off.  Lorre Munroe 10/26/2020, 10:07 AM

## 2020-10-26 NOTE — Progress Notes (Signed)
Nutrition Education Note  RD consulted for nutrition education regarding CHF and diabetes.  No results found for: HGBA1C   PTA DM medications are 500 mg metformin BID.   Labs reviewed: CBGS: 121-184 (inpatient orders for glycemic control are 10 mg jardiance daily).    Spoke with pt, who was siting in recliner chair at time of visit. He reports his diet has overall improved since he temporarily moved to [place] to live with his daughter, who is cardiac nurse. Per his report, daughter is very vigilant about diet, however, pt is also focused on losing weight and controlling blood sugars. Diet at home consists of a lot of vegetables, salads, chicken, and fish. He admits to drinks excessive amount of fluid. He is unsure if his daughter has a scale, but is willing to purchase one.  RD provided "Heart Healthy, Consistent Carbohydrate Nutrition Therapy" handout from the Academy of Nutrition and Dietetics. Reviewed patient's dietary recall. Provided examples on ways to decrease sodium intake in diet. Discouraged intake of processed foods and use of salt shaker. Encouraged fresh fruits and vegetables as well as whole grain sources of carbohydrates to maximize fiber intake.   RD discussed why it is important for patient to adhere to diet recommendations, and emphasized the role of fluids, foods to avoid, and importance of weighing self daily.   Discussed different food groups and their effects on blood sugar, emphasizing carbohydrate-containing foods. Provided list of carbohydrates and recommended serving sizes of common foods.  Discussed importance of controlled and consistent carbohydrate intake throughout the day. Provided examples of ways to balance meals/snacks and encouraged intake of high-fiber, whole grain complex carbohydrates. Teach back method used.  Expect good compliance.  Body mass index is 32.39 kg/m. Pt meets criteria for obesity, class II based on current BMI.  Current diet order is  heart healthy/ carb modified with 2 L fluid restriction, patient is consuming approximately 100% of meals at this time. Labs and medications reviewed. No further nutrition interventions warranted at this time. RD contact information provided. If additional nutrition issues arise, please re-consult RD.   Chad Hahn, RD, LDN, CDCES Registered Dietitian II Certified Diabetes Care and Education Specialist Please refer to [facility] for RD and/or RD on-call/weekend/after hours pager

## 2020-10-26 NOTE — Progress Notes (Signed)
PT Cancellation Note  Patient Details Name: Chad Hahn MRN: [id number] DOB: 02-Oct-1945   Cancelled Treatment:    Reason Eval/Treat Not Completed: PT screened, no needs identified, will sign off. New PT order acknowledged. PT observes patient ambulating for multiple laps on unit independently this morning. Pt reports his mobility is at baseline and expresses no concerns at this time. Pt reports he has been ambulating 4-5 time daily this admission. PT encourages continued ambulation. Pt has no current acute PT needs. Acute PT signing off.   Arlyss Gandy 10/26/2020, 10:00 AM

## 2020-10-26 NOTE — Progress Notes (Signed)
Progress Note  Patient Name: Chad Hahn Date of Encounter: 10/27/2020  [facility] Cardiologist: None   Subjective   Doing well, transitioned to PO lasix yesterday. Hoping to go home today.  Cr 1.28-->1.03  Inpatient Medications    Scheduled Meds:  apixaban  5 mg Oral BID   carvedilol  25 mg Oral BID WC   clopidogrel  75 mg Oral Daily   empagliflozin  10 mg Oral Daily   finasteride  5 mg Oral QHS   furosemide  60 mg Oral Daily   rosuvastatin  40 mg Oral QHS   sacubitril-valsartan  1 tablet Oral BID   sodium chloride flush  3 mL Intravenous Q12H   spironolactone  25 mg Oral Daily   tamsulosin  0.4 mg Oral Daily   Continuous Infusions:  PRN Meds: acetaminophen **OR** acetaminophen, nitroGLYCERIN, polyethylene glycol   Vital Signs    Vitals:   10/26/20 1927 10/27/20 0352 10/27/20 0730 10/27/20 0927  BP: (!) 103/58 109/70 118/82 105/63  Pulse: 72 70 70 70  Resp: 16 18    Temp: 97.6 F (36.4 C) 97.6 F (36.4 C)    TempSrc: Oral Oral    SpO2: 96% 95% 97%   Weight:  104.8 kg    Height:        Intake/Output Summary (Last 24 hours) at 10/27/2020 0933 Last data filed at 10/27/2020 0732 Gross per 24 hour  Intake 838 ml  Output 2225 ml  Net -1387 ml   Last 3 Weights 10/27/2020 10/26/2020 10/25/2020  Weight (lbs) 231 lb 232 lb 3.2 oz 234 lb 4.8 oz  Weight (kg) 104.781 kg 105.325 kg 106.278 kg      Telemetry    V-paced with underlying Aflutter - Personally Reviewed  ECG    No new tracing 10/27/20 - Personally Reviewed  Physical Exam   GEN: No acute distress.  Sitting up in a chair Neck: No JVD Cardiac: RR, no murmurs Respiratory: CTAB, no wheezes GI: Soft, nontender, non-distended  MS: No LE edema, warm Neuro:  Nonfocal  Psych: Normal affect   Labs    High Sensitivity Troponin:   Recent Labs  Lab 10/21/20 1240 10/21/20 1440  TROPONINIHS 15 16      Chemistry Recent Labs  Lab 10/21/20 1240 10/22/20 0152 10/23/20 0318 10/25/20 0416  10/26/20 0247 10/27/20 0314  NA 139 140   < > 138 138 136  K 3.6 3.5   < > 3.9 3.4* 3.6  CL 102 101   < > 101 101 101  CO2 27 31   < > 29 28 26   GLUCOSE 111* 137*   < > 134* 119* 133*  BUN 18 18   < > 19 25* 29*  CREATININE 0.91 1.08   < > 1.13 1.28* 1.03  CALCIUM 9.6 9.3   < > 9.7 9.5 9.5  PROT 7.2 6.5  --   --   --   --   ALBUMIN 4.0 3.7  --   --   --   --   AST 27 22  --   --   --   --   ALT 29 25  --   --   --   --   ALKPHOS 120 110  --   --   --   --   BILITOT 1.1 1.3*  --   --   --   --   GFRNONAA >60 >60   < > >60 59* >60  ANIONGAP 10  8   < > 8 9 9    < > = values in this interval not displayed.     Hematology Recent Labs  Lab 10/21/20 1240 10/22/20 0152  WBC 5.3 5.0  RBC 4.89 4.65  HGB 13.3 12.6*  HCT 42.9 39.7  MCV 87.7 85.4  MCH 27.2 27.1  MCHC 31.0 31.7  RDW 17.2* 17.1*  PLT 149* 132*    BNP Recent Labs  Lab 10/21/20 1251  BNP 267.2*     DDimer No results for input(s): DDIMER in the last 168 hours.   Radiology    No results found.  Cardiac Studies   TTE 10/22/20  1. 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 The myocardium in  this region appears thinned consistent with prior infarction. No apical  thrombus on contrast imaging. All other LV segments are hypokinetic. EF  severely reduced ~20-25%. Left  ventricular ejection fraction, by estimation, is 20 to 25%. The left  ventricle has severely decreased function. The left ventricle demonstrates  regional wall motion abnormalities (see scoring diagram/findings for  description). There is mild concentric  left ventricular hypertrophy. Indeterminate diastolic filling due to E-A  fusion.   2. Right ventricular systolic function is severely reduced. The right  ventricular size is normal. There is normal pulmonary artery systolic  pressure. The estimated right ventricular systolic pressure is 33.8 mmHg.   3. The mitral valve is grossly normal. Trivial mitral valve  regurgitation. No  evidence of mitral stenosis.   4. The aortic valve is grossly normal. Aortic valve regurgitation is not  visualized. No aortic stenosis is present.   5. The inferior vena cava is dilated in size with <50% respiratory  variability, suggesting right atrial pressure of 15 mmHg.   Conclusion(s)/Recommendation(s): Findings consistent with ischemic  cardiomyopathy.  Patient Profile     75 y.o. male with a hx of chronic systolic CHF, St. Jude ICD 2013 with generator change 12/2018, CAD s/p CABG in 1990 with redo in 1996 and PCIs, DM, HTN, OSA on CPAP, BPH who presented with worsening SOB, orthopnea and chest pressure found to have acute on chronic systolic HF exacerbation as well as newly diagnosed atrial flutter for which Cardiology has been consulted.   Assessment & Plan    #Acute on chronic systolic heart failure exacerbation: Patient with known likely ischemic cardiomyopathy s/p ICD placement previously followed in [place]. TTE on 10/22/20 with EF 20-25%, akinetic/aneurysmal apex, mild LVH, dilated IVC. Volume status improving with diuresis. -Continue lasix 60mg  PO daily -Continue coreg 25mg  BID -Continue jardiance 10mg  daily -Continue entresto 24/26mg  BID  -Continue spironolactone 25mg  daily -Monitor I/Os and daily weights -Low Na diet -Okay to discharge home from CV perspective; will arrange follow-up  #Newly Diagnosed Atrial Flutter: CHADs-vasc 5. Has been in aflutter since 06/2020. Newly diagnosed but not detected as patient has not been sending in remote device checks as he left his monitoring equipment in [place].  -Continue apixaban 5mg  BID for anticoagulation -Continue coreg 25mg  BID -Plan for DCCV after 3 weeks of AC; will arrange follow-up  #CAD s/p CABG in 1990 with re-do 1996 and subsequent PCIs: Likely acute presentation due to HF exacerbation as detailed above with low suspicion of worsening ischemia.  -Stopped ASA given need for [facility] -Continue plavix 75mg  daily -Continue crestor  40mg  daily -Continue coreg 25mg  BID  #HTN: -Continue coreg 25mg  BID -Continue spironolactone 25mg  daily -Continue entresto 24/26mg  BID    #DMII: -Continue jardiance 10mg  as above  Okay to discharge  home from a CV perspective on current medications. Will arrange CV follow-up. Needs repeat BMET at that visit.  For questions or updates, please contact [facility] Please consult [email or web address] for contact info under        Signed, Meriam Sprague, MD  10/27/2020, 9:33 AM

## 2020-10-26 NOTE — Progress Notes (Signed)
Progress Note  Patient Name: Chad Hahn Date of Encounter: 10/26/2020  [facility] Cardiologist: Dr. Angelina Sheriff  Subjective   Clinically improved after diuresis.  Denies chest pain or shortness of breath.  Inpatient Medications    Scheduled Meds:  apixaban  5 mg Oral BID   carvedilol  25 mg Oral BID WC   clopidogrel  75 mg Oral Daily   empagliflozin  10 mg Oral Daily   finasteride  5 mg Oral QHS   furosemide  60 mg Oral Daily   rosuvastatin  40 mg Oral QHS   [START ON 10/27/2020] sacubitril-valsartan  1 tablet Oral BID   sodium chloride flush  3 mL Intravenous Q12H   spironolactone  25 mg Oral Daily   tamsulosin  0.4 mg Oral Daily   Continuous Infusions:  PRN Meds: acetaminophen **OR** acetaminophen, nitroGLYCERIN, polyethylene glycol   Vital Signs    Vitals:   10/25/20 1208 10/25/20 2002 10/26/20 0046 10/26/20 0412  BP: 119/75 (!) 97/58  118/75  Pulse: 71 70 68 69  Resp: 19 18 18 18   Temp: 98.6 F (37 C) 98 F (36.7 C)    TempSrc: Oral Oral    SpO2: 96% 95% 96% 97%  Weight:    105.3 kg  Height:        Intake/Output Summary (Last 24 hours) at 10/26/2020 0816 Last data filed at 10/26/2020 0757 Gross per 24 hour  Intake 540 ml  Output 2950 ml  Net -2410 ml   Last 3 Weights 10/26/2020 10/25/2020 10/24/2020  Weight (lbs) 232 lb 3.2 oz 234 lb 4.8 oz 238 lb  Weight (kg) 105.325 kg 106.278 kg 107.956 kg      Telemetry    Ventricular paced rhythm- Personally Reviewed  ECG    Not performed today- Personally Reviewed  Physical Exam   GEN: No acute distress.   Neck: No JVD Cardiac: RRR, no murmurs, rubs, or gallops.  Respiratory: Clear to auscultation bilaterally. GI: Soft, nontender, non-distended  MS: No edema; No deformity. Neuro:  Nonfocal  Psych: Normal affect   Labs    High Sensitivity Troponin:   Recent Labs  Lab 10/21/20 1240 10/21/20 1440  TROPONINIHS 15 16      Chemistry Recent Labs  Lab 10/21/20 1240 10/22/20 0152  10/23/20 0318 10/24/20 0351 10/25/20 0416 10/26/20 0247  NA 139 140   < > 139 138 138  K 3.6 3.5   < > 3.4* 3.9 3.4*  CL 102 101   < > 102 101 101  CO2 27 31   < > 28 29 28   GLUCOSE 111* 137*   < > 117* 134* 119*  BUN 18 18   < > 21 19 25*  CREATININE 0.91 1.08   < > 1.02 1.13 1.28*  CALCIUM 9.6 9.3   < > 9.2 9.7 9.5  PROT 7.2 6.5  --   --   --   --   ALBUMIN 4.0 3.7  --   --   --   --   AST 27 22  --   --   --   --   ALT 29 25  --   --   --   --   ALKPHOS 120 110  --   --   --   --   BILITOT 1.1 1.3*  --   --   --   --   GFRNONAA >60 >60   < > >60 >60 59*  ANIONGAP 10 8   < >  9 8 9    < > = values in this interval not displayed.     Hematology Recent Labs  Lab 10/21/20 1240 10/22/20 0152  WBC 5.3 5.0  RBC 4.89 4.65  HGB 13.3 12.6*  HCT 42.9 39.7  MCV 87.7 85.4  MCH 27.2 27.1  MCHC 31.0 31.7  RDW 17.2* 17.1*  PLT 149* 132*    BNP Recent Labs  Lab 10/21/20 1251  BNP 267.2*     DDimer No results for input(s): DDIMER in the last 168 hours.   Radiology    No results found.  Cardiac Studies   2D echocardiogram (10/22/2020)  IMPRESSIONS     1. 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 The myocardium in  this region appears thinned consistent with prior infarction. No apical  thrombus on contrast imaging. All other LV segments are hypokinetic. EF  severely reduced ~20-25%. Left  ventricular ejection fraction, by estimation, is 20 to 25%. The left  ventricle has severely decreased function. The left ventricle demonstrates  regional wall motion abnormalities (see scoring diagram/findings for  description). There is mild concentric  left ventricular hypertrophy. Indeterminate diastolic filling due to E-A  fusion.   2. Right ventricular systolic function is severely reduced. The right  ventricular size is normal. There is normal pulmonary artery systolic  pressure. The estimated right ventricular systolic pressure is 33.8 mmHg.   3. The mitral  valve is grossly normal. Trivial mitral valve  regurgitation. No evidence of mitral stenosis.   4. The aortic valve is grossly normal. Aortic valve regurgitation is not  visualized. No aortic stenosis is present.   5. The inferior vena cava is dilated in size with <50% respiratory  variability, suggesting right atrial pressure of 15 mmHg.   Patient Profile     75 y.o. male with a hx of chronic systolic CHF, St. Jude ICD 2013 with generator change 12/2018, CAD s/p CABG in 1990 with redo in 1996 and PCIs, DM, HTN, OSA on CPAP, BPH who presented with worsening SOB, orthopnea and chest pressure found to have acute on chronic systolic HF exacerbation as well as newly diagnosed atrial flutter for which Cardiology has been consulted.   Assessment & Plan    1: Acute on chronic systolic heart failure-ejection fraction 20 to 25% per recent 2D echo.  Getting IV diuresis with I/O- 13 L.  He has no peripheral edema and his lungs are clear.  He was on carvedilol and started on Entresto.  He is also on spironolactone.  Agree with transition from IV to p.o. diuretics.  His renal function has remained stable.  2: Newly diagnosed atrial flutter-atrial flutter detected by ICD apparently with new onset in March.  I suspect this is contributory to his volume overload.  He did have monitoring equipment at home in April however since he has been on the road he has not used this.  His ICD was interrogated by [facility] rep.  He has not had a discharge.  Agree with starting apixaban 5 mg p.o. twice daily, continuing current medications for heart failure and anticipate outpatient DC cardioversion in 3 to 4 weeks.  3: Coronary artery disease-history of CAD status post CABG in 1990 with redo in 1996.  He has had multiple PCI and stent procedure since that time.  Agree with stopping aspirin and continuing Plavix in addition to her other medications.  1 question to discuss is whether or not he requires repeat cardiac  catheterization since he has noticed  substernal chest pressure with exertion over the last several months and has been 3 to 4 years since his last cardiac catheterization.  I think it would be important to get his last heart cath results to review.  4: Essential hypertension-blood pressure under good control on current medications.  5: Type 2 diabetes-on Jardiance     For questions or updates, please contact [facility] Please consult [email or web address] for contact info under        Signed, Nanetta Batty, MD  10/26/2020, 8:16 AM

## 2020-10-26 NOTE — Progress Notes (Signed)
Pt placed himself on CPAP dreamstation w/out difficulty. RT will continue to monitor.

## 2020-10-26 NOTE — Plan of Care (Signed)
  Problem: Cardiac: Goal: Ability to achieve and maintain adequate cardiopulmonary perfusion will improve Outcome: Progressing   Problem: Clinical Measurements: Goal: Cardiovascular complication will be avoided Outcome: Progressing   

## 2020-10-26 NOTE — Progress Notes (Signed)
PROGRESS NOTE    Ziyan Schoon  VFI:[id number] DOB: Feb 23, 1946 DOA: 10/21/2020 PCP: System, Provider Not In    Brief Narrative:  Mr. Grape was admitted to the hospital with the working diagnosis of acute on chronic systolic heart failure decompensation.    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  Reported few weeks of worsening dyspnea, and lower extremity edema, associated with chest pressure, no improving worsening factors.  Currently visiting his daughter from [place].  07/2020 hospitalization in [place] for cholecystitis status postcholecystectomy, and then he had exacerbation of his heart failure, ejection fraction 30 to 40%, at home taking furosemide. On his initial physical examination blood pressure 146/87, heart rate 70, respiratory rate 22, oxygen saturation 98% on supplemental oxygen.  His lungs had no wheezing or rales, heart S1-S2, present, rhythmic, soft abdomen, positive bilateral lower extremity edema.   Sodium 139, potassium 3.6, chloride 102, bicarb 27, glucose 111, BUN 18, creatinine 0.91, BNP 267, high sensitive troponin 15-16, white count 5.3, hemoglobin 13.3, hematocrit 42.9, platelets 149. SARS COVID-19 negative.   Urinalysis specific gravity 1.006.   Chest radiograph with cardiomegaly, positive hilar vascular congestion.   EKG 70 bpm, rightward axis, QTC 542, prolonged QRS, a sensed, V paced, no significant ST segment or T wave changes.   Patient placed on aggressive diuresis with good toleration.   Echocardiogram with worsening biventricular failure.    Heart failure guideline therapy in place and adjusting medical regimen. Per echocardiography worsening LV systolic function, in the setting of advanced, can not rule out recent ischemic event. Consult cardiology for possible ischemic re-evaluation and heart  failure team follow up.    Cardiology has been consulted, patient has paroxysmal atrial flutter, per device interrogation. Started on apixaban for anticoagulation.    Assessment & Plan:   Principal Problem:   Acute on chronic systolic CHF (congestive heart failure) (HCC) Active Problems:   Essential hypertension   Ischemic congestive cardiomyopathy (HCC)   Obstructive sleep apnea syndrome   Chest pain, rule out acute myocardial infarction   Diabetes (HCC)   CAD (coronary artery disease)   CHF (congestive heart failure) (HCC)   Acute on chronic systolic heart failure exacerbation, biventricular failure (LV EF 20 to 25%).  Ischemic cardiomyopathy. Echocardiogram with further reduction in LV systolic function 20 to 25%, entire apex is akinetic and possible aneurysmal. Severe LV dilatation. RV systolic function severely reduced. RSVP 33,8 mmHg.   Hypervolemia improving.   Urine output 2,970 ml over last 24 hrs.    Medical management with carvedilol, entresto, spironolactone, and empagliflozin. Diuresis with furosemide 60 mg daily.   Plan for possible dc home tomorrow, follow as outpatient.,    2. New diagnosed atrial flutter. Continue with V paced rhythm. Anticoagulation with apixaban.    2. CAD/ dyslipidemia. Sp CABG in the past 1980 and 1990.  Patient had several coronary angioplasties 2014 and 2016, last cardiac catheterization in 2018.   Medical therapy with clopidogrel, atorvastatin, b blocker and ARB.  Follow as outpatient.     3. HTN. On carvedilol and entresto.     4. T2DM. Tolerating well empagliflozin. .    5. BPH. Continue with tamsulosin and finesteride    6. Hypokalemia. K today is 3,4 with serum cr at 1,28 with serum bicarbonate at 28 Will add 40 meq Kcl today and follow renal panel in am.  Furosemide change to oral dosing  60 mg po daily.    7. Obesity class 1/ OSA . BMI is 34.1  Cpap at night. .    Status is: Inpatient  Remains inpatient appropriate  because:Inpatient level of care appropriate due to severity of illness  Dispo: The patient is from: Home              Anticipated d/c is to: Home              Patient currently is not medically stable to d/c. Possible dc home tomorrow if continue to improve.    Difficult to place patient No   DVT prophylaxis: Apixaban   Code Status:   full  Family Communication:  No family at the bedside     Consultants:  Cardiology    Subjective: Patient is feeling better, edema and dyspnea continue to improve, no chest pain, no nausea or vomiting.   Objective: Vitals:   10/25/20 2002 10/26/20 0046 10/26/20 0412 10/26/20 1152  BP: (!) 97/58  118/75 116/70  Pulse: 70 68 69 70  Resp: 18 18 18 16   Temp: 98 F (36.7 C)     TempSrc: Oral     SpO2: 95% 96% 97% 98%  Weight:   105.3 kg   Height:        Intake/Output Summary (Last 24 hours) at 10/26/2020 1218 Last data filed at 10/26/2020 0900 Gross per 24 hour  Intake 480 ml  Output 2250 ml  Net -1770 ml   Filed Weights   10/24/20 0411 10/25/20 0403 10/26/20 0412  Weight: 108 kg 106.3 kg 105.3 kg    Examination:   General: Not in pain or dyspnea  Neurology: Awake and alert, non focal  E ENT: no pallor, no icterus, oral mucosa moist Cardiovascular: No JVD. S1-S2 present, rhythmic, no gallops, rubs, or murmurs. +/++ bilateral pitting lower extremity edema. Pulmonary: positive breath sounds bilaterally, adequate air movement, no wheezing, rhonchi or rales. Gastrointestinal. Abdomen soft and non tender Skin. No rashes Musculoskeletal: no joint deformities     Data Reviewed: I have personally reviewed following labs and imaging studies  CBC: Recent Labs  Lab 10/21/20 1240 10/22/20 0152  WBC 5.3 5.0  HGB 13.3 12.6*  HCT 42.9 39.7  MCV 87.7 85.4  PLT 149* 132*   Basic Metabolic Panel: Recent Labs  Lab 10/21/20 2232 10/22/20 0152 10/23/20 0318 10/24/20 0351 10/25/20 0416 10/26/20 0247  NA  --  140 140 139 138 138  K  --   3.5 3.6 3.4* 3.9 3.4*  CL  --  101 102 102 101 101  CO2  --  31 30 28 29 28   GLUCOSE  --  137* 115* 117* 134* 119*  BUN  --  18 18 21 19  25*  CREATININE  --  1.08 1.03 1.02 1.13 1.28*  CALCIUM  --  9.3 9.4 9.2 9.7 9.5  MG 2.3  --   --  2.1  --  2.4   GFR: Estimated Creatinine Clearance: 62.5 mL/min (A) (by C-G formula based on SCr of 1.28 mg/dL (H)). Liver Function Tests: Recent Labs  Lab 10/21/20 1240 10/22/20 0152  AST 27 22  ALT 29 25  ALKPHOS 120 110  BILITOT 1.1 1.3*  PROT 7.2 6.5  ALBUMIN 4.0 3.7   Recent Labs  Lab 10/21/20 1240  LIPASE 45   No results for input(s): AMMONIA in the last 168 hours. Coagulation Profile: No results for input(s): INR, PROTIME in the last 168 hours. Cardiac Enzymes: No results  for input(s): CKTOTAL, CKMB, CKMBINDEX, TROPONINI in the last 168 hours. BNP (last 3 results) No results for input(s): PROBNP in the last 8760 hours. HbA1C: No results for input(s): HGBA1C in the last 72 hours. CBG: Recent Labs  Lab 10/25/20 0542 10/25/20 1210 10/25/20 2054 10/26/20 0537 10/26/20 1130  GLUCAP 133* 121* 184* 147* 181*   Lipid Profile: No results for input(s): CHOL, HDL, LDLCALC, TRIG, CHOLHDL, LDLDIRECT in the last 72 hours. Thyroid Function Tests: Recent Labs    10/25/20 0416  TSH 1.179   Anemia Panel: No results for input(s): VITAMINB12, FOLATE, FERRITIN, TIBC, IRON, RETICCTPCT in the last 72 hours.    Radiology Studies: I have reviewed all of the imaging during this hospital visit personally     Scheduled Meds:  apixaban  5 mg Oral BID   carvedilol  25 mg Oral BID WC   clopidogrel  75 mg Oral Daily   empagliflozin  10 mg Oral Daily   finasteride  5 mg Oral QHS   furosemide  60 mg Oral Daily   rosuvastatin  40 mg Oral QHS   [START ON 10/27/2020] sacubitril-valsartan  1 tablet Oral BID   sodium chloride flush  3 mL Intravenous Q12H   spironolactone  25 mg Oral Daily   tamsulosin  0.4 mg Oral Daily   Continuous  Infusions:   LOS: 4 days        Aleayah [place]ico Annett Gula, MD

## 2020-10-27 ENCOUNTER — Other Ambulatory Visit (HOSPITAL_COMMUNITY): Payer: Self-pay

## 2020-10-27 LAB — BASIC METABOLIC PANEL
Anion gap: 9 (ref 5–15)
BUN: 29 mg/dL — ABNORMAL HIGH (ref 8–23)
CO2: 26 mmol/L (ref 22–32)
Calcium: 9.5 mg/dL (ref 8.9–10.3)
Chloride: 101 mmol/L (ref 98–111)
Creatinine, Ser: 1.03 mg/dL (ref 0.61–1.24)
GFR, Estimated: >60 mL/min (ref >60)
Glucose, Bld: 133 mg/dL — ABNORMAL HIGH (ref 70–99)
Potassium: 3.6 mmol/L (ref 3.5–5.1)
Sodium: 136 mmol/L (ref 135–145)

## 2020-10-27 LAB — GLUCOSE, CAPILLARY: Glucose-Capillary: 145 mg/dL — ABNORMAL HIGH (ref 70–99)

## 2020-10-27 MED ORDER — POTASSIUM CHLORIDE CRYS ER 20 MEQ PO TBCR
20.0000 meq | EXTENDED_RELEASE_TABLET | Freq: Once | ORAL | Status: AC
  Administered 2020-10-27: 20 meq via ORAL
  Filled 2020-10-27: qty 1

## 2020-10-27 MED ORDER — SACUBITRIL-VALSARTAN 24-26 MG PO TABS
1.0000 | ORAL_TABLET | Freq: Two times a day (BID) | ORAL | 0 refills | Status: DC
  Filled 2020-10-27: qty 60, 30d supply, fill #0

## 2020-10-27 MED ORDER — EMPAGLIFLOZIN 10 MG PO TABS
10.0000 mg | ORAL_TABLET | Freq: Every day | ORAL | 0 refills | Status: DC
Start: 2020-10-28 — End: 2020-11-20
  Filled 2020-10-27: qty 14, 14d supply, fill #0

## 2020-10-27 MED ORDER — FUROSEMIDE 20 MG PO TABS
60.0000 mg | ORAL_TABLET | Freq: Every day | ORAL | 0 refills | Status: DC
  Filled 2020-10-27: qty 90, 30d supply, fill #0

## 2020-10-27 MED ORDER — SPIRONOLACTONE 25 MG PO TABS
25.0000 mg | ORAL_TABLET | Freq: Every day | ORAL | 0 refills | Status: DC
Start: 2020-10-28 — End: 2020-11-20
  Filled 2020-10-27: qty 30, 30d supply, fill #0

## 2020-10-27 MED ORDER — APIXABAN 5 MG PO TABS
5.0000 mg | ORAL_TABLET | Freq: Two times a day (BID) | ORAL | 0 refills | Status: DC
  Filled 2020-10-27: qty 60, 30d supply, fill #0

## 2020-10-27 MED ORDER — POTASSIUM CHLORIDE CRYS ER 20 MEQ PO TBCR
20.0000 meq | EXTENDED_RELEASE_TABLET | Freq: Every day | ORAL | 0 refills | Status: DC
Start: 2020-10-27 — End: 2021-03-05
  Filled 2020-10-27: qty 30, 30d supply, fill #0

## 2020-10-27 NOTE — Progress Notes (Signed)
Went over discharge instructions with patient at bedside. Patient is aware that he has a heart and vascular appointment in the next few weeks and will need to make his own PCP appointment with whom he deems fit to oversee his care. Also went over medication changes with patient and [facility] pharmacy medication is at the bedside. Patient verbalize understanding of medication changes and has no further questions for RN. Tele monitor has been removed and CCMD has been notified. PIV has been removed, patient has tolerated well and site is clean,dry and intact. Transportation has been called. Patient is getting dressed.

## 2020-10-27 NOTE — Discharge Summary (Signed)
Physician Discharge Summary  Chad Hahn HKV:[id number] DOB: 30-Jan-1946 DOA: 10/21/2020  PCP: System, Provider Not In  Admit date: 10/21/2020 Discharge date: 10/27/2020  Admitted From: Home  Disposition:   Home   Recommendations for Outpatient Follow-up and new medication changes:  Follow up with Primary care in 7 to 10 days.  Follow up with cardiology as scheduled. Increase furosemide dose to 60 mg daily. Started on heart failure guideline therapy Discontinue aspirin and started on apixaban for atrial flutter anticoagulation  Follow renal function and electrolytes as outpatient.   Home Health: no   Equipment/Devices: no    Discharge Condition: stable  CODE STATUS: full  Diet recommendation:  heart healthy and diabetic prudent.   Brief/Interim Summary: Chad Hahn was admitted to the hospital with the working diagnosis of acute on chronic systolic heart failure decompensation.    75 year old male, Chad Hahn with  past medical history for heart failure, hypertension, coronary artery disease status post bypass grafting, type 2 diabetes mellitus and obstructive sleep apnea.  Reported few weeks of worsening dyspnea, and lower extremity edema, associated with chest pressure, no improving or worsening factors.  Currently visiting his daughter from [place].  07/2020 hospitalization in [place] for cholecystitis status postcholecystectomy, and then he had exacerbation of his heart failure, reported ejection fraction 30 to 40%, at home taking furosemide. On his initial physical examination blood pressure 146/87, heart rate 70, respiratory rate 22, oxygen saturation 98% on supplemental oxygen.  His lungs had no wheezing or rales, heart S1-S2, present, rhythmic, soft abdomen, positive bilateral lower extremity edema.   Sodium 139, potassium 3.6, chloride 102, bicarb 27, glucose 111, BUN 18, creatinine 0.91, BNP 267, high sensitive troponin 15-16, white count 5.3, hemoglobin 13.3, hematocrit 42.9,  platelets 149. SARS COVID-19 negative.   Urinalysis specific gravity 1.006.   Chest radiograph with cardiomegaly, positive hilar vascular congestion.   EKG 70 bpm, rightward axis, QTC 542, prolonged QRS, a sensed, V paced, no significant ST segment or T wave changes.   Patient placed on aggressive diuresis with good toleration.   Echocardiogram with worsening biventricular failure.    Heart failure guideline therapy started with good toleration.  Because worsening LV systolic function, in the setting of advanced CAD. Consulted cardiology for possible ischemic re-evaluation and heart failure team follow up.    Device was interrogated and found paroxysmal atrial flutter.  Started on apixaban for anticoagulation.    Patient will follow up with cardiology as outpatient.,   Acute on chronic systolic heart failure exacerbation, biventricular failure, LV, EF 20 to 25%, ischemic cardiomyopathy. Patient was admitted to the telemetry cardiac ward, he received aggressive diuresis with intravenous furosemide. Negative fluid balance was achieved -15,616 mL since admission with significant improvement of his symptoms.  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  Severe LV dilatation.  RV systolic function severely reduced.  RSVP 33.8.  Patient was placed on guideline directed heart failure therapy including carvedilol, Entresto, spironolactone and empagliflozin with good toleration. Continue diuresis with furosemide.  Received heart failure teaching.  Is worsening LV function is presumed to be due to poor medical compliance and underlying atrial flutter. We will follow-up as an outpatient with cardiology.  2.  New diagnosed atrial flutter.  Patient remained V-paced, interrogation of device showed atrial flutter (paroxysmal). He has been placed on apixaban for  anticoagulation. DCCV cardioversion after 3 weeks and oral anticoagulation.  3.  Coronary  disease/dyslipidemia.  Patient known to have ischemic heart disease, status post bypass graft in 1990 in 1996.  Angioplasty 2014 in 2016.  Last cardiac catheterization 2018.  Continue antiacid therapy with clopidogrel, continue statin and beta-blockade.  4.  Hypertension.  Continue blood pressure control with carvedilol and Entresto.  5.  Type 2 diabetes mellitus.  Continue glucose control with empagliflozin, resume metformin at discharge.  6.  BPH.  No signs of urinary retention, continue tamsulosin and finasteride.  7.  Hypokalemia.  Related to diuresis, continue potassium supplementation at discharge.  Follow-up kidney function as an outpatient.  8.  Obesity class I, OSA.  Calculated BMI 34.1.  Continue CPAP at night.     Discharge Diagnoses:  Principal Problem:   Acute on chronic systolic CHF (congestive heart failure) (HCC) Active Problems:   Essential hypertension   Ischemic congestive cardiomyopathy (HCC)   Obstructive sleep apnea syndrome   Chest pain, rule out acute myocardial infarction   Diabetes (HCC)   CAD (coronary artery disease)   CHF (congestive heart failure) (HCC)    Discharge Instructions   Allergies as of 10/27/2020   No Known Allergies      Medication List     STOP taking these medications    aspirin 81 MG EC tablet   lisinopril 10 MG tablet Commonly known as: ZESTRIL       TAKE these medications    apixaban 5 MG Tabs tablet Commonly known as: ELIQUIS Take 1 tablet (5 mg total) by mouth 2 (two) times daily.   carvedilol 25 MG tablet Commonly known as: COREG Take 25 mg by mouth 2 (two) times daily with a meal.   clopidogrel 75 MG tablet Commonly known as: PLAVIX Take 75 mg by mouth every morning.   empagliflozin 10 MG Tabs tablet Commonly known as: JARDIANCE Take 1 tablet (10 mg total) by mouth daily. Start taking on: October 28, 2020    finasteride 5 MG tablet Commonly known as: PROSCAR Take 5 mg by mouth every evening.   furosemide 20 MG tablet Commonly known as: LASIX Take 3 tablets (60 mg total) by mouth daily. Start taking on: October 28, 2020 What changed:  how much to take when to take this   metFORMIN 500 MG 24 hr tablet Commonly known as: GLUCOPHAGE-XR Take 500 mg by mouth daily with breakfast.   nitroGLYCERIN 0.4 MG SL tablet Commonly known as: NITROSTAT Place 0.4 mg under the tongue every 5 (five) minutes as needed for chest pain.   potassium chloride SA 20 MEQ tablet Commonly known as: KLOR-CON Take 1 tablet (20 mEq total) by mouth daily for 30 doses.   rosuvastatin 40 MG tablet Commonly known as: CRESTOR Take 40 mg by mouth at bedtime.   sacubitril-valsartan 24-26 MG Commonly known as: ENTRESTO Take 1 tablet by mouth 2 (two) times daily.   spironolactone 25 MG tablet Commonly known as: ALDACTONE Take 1 tablet (25 mg total) by mouth daily. Start taking on: October 28, 2020   tamsulosin 0.4 MG Caps capsule Commonly known as: FLOMAX Take 0.4 mg by mouth at bedtime.        No Known Allergies  Consultations: Cardiology    Procedures/Studies: DG Chest 2 View  Result Date: 10/21/2020 CLINICAL DATA:  Chest pain EXAM: CHEST - 2 VIEW COMPARISON:  None. FINDINGS: Left chest wall pacer device is noted with leads in the right atrial appendage, coronary sinus and right ventricle. Status post median sternotomy and CABG. Mild cardiac enlargement. No pleural effusion  or edema. The visualized skeletal structures are unremarkable. IMPRESSION: No acute cardiopulmonary abnormalities. Cardiac enlargement without signs of CHF. Electronically Signed   By: Signa Kell M.D.   On: 10/21/2020 13:32   ECHOCARDIOGRAM COMPLETE  Result Date: 10/22/2020    ECHOCARDIOGRAM REPORT   Patient Name:   Adair Laundry Date of Exam: 10/22/2020 Medical Rec #:  [id number]    Height:       71.0 in Accession #:    [id number]    Weight:       248.9 lb Date of Birth:  1945/09/20    BSA:          2.314 m Patient Age:    74 years     BP:           139/82 mmHg Patient Gender: M            HR:           72 bpm. Exam Location:  Inpatient Procedure: 2D Echo, Cardiac Doppler, Color Doppler and Intracardiac            Opacification Agent Indications:    R07.9* Chest pain, unspecified  History:        Patient has no prior history of Echocardiogram examinations.                 Pacemaker; Risk Factors:Hypertension and Diabetes.  Sonographer:    Elmarie Shiley Dance Referring Phys: [id number] Cecille Po MELVIN IMPRESSIONS  1. 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 The myocardium in this region appears thinned consistent with prior infarction. No apical thrombus on contrast imaging. All other LV segments are hypokinetic. EF severely reduced ~20-25%. Left ventricular ejection fraction, by estimation, is 20 to 25%. The left ventricle has severely decreased function. The left ventricle demonstrates regional wall motion abnormalities (see scoring diagram/findings for description). There is mild concentric left ventricular hypertrophy. Indeterminate diastolic filling due to E-A fusion.  2. Right ventricular systolic function is severely reduced. The right ventricular size is normal. There is normal pulmonary artery systolic pressure. The estimated right ventricular systolic pressure is 33.8 mmHg.  3. The mitral valve is grossly normal. Trivial mitral valve regurgitation. No evidence of mitral stenosis.  4. The aortic valve is grossly normal. Aortic valve regurgitation is not visualized. No aortic stenosis is present.  5. The inferior vena cava is dilated in size with <50% respiratory variability, suggesting right atrial pressure of 15 mmHg. Conclusion(s)/Recommendation(s): Findings consistent with ischemic cardiomyopathy. 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 The myocardium in this region appears thinned consistent  with prior infarction. No apical thrombus on contrast imaging. All other LV segments are hypokinetic. EF severely reduced ~20-25%. Left ventricular ejection fraction, by estimation, is 20 to 25%. The left ventricle has severely decreased function. The left ventricle demonstrates regional wall motion abnormalities. The left ventricular internal cavity size was normal in size. There is mild concentric left ventricular hypertrophy. Indeterminate diastolic filling due to E-A fusion.  LV Wall Scoring: The entire apex is akinetic. Right Ventricle: The right ventricular size is normal. No increase in right ventricular wall thickness. Right ventricular systolic function is severely reduced. There is normal pulmonary artery systolic pressure. The tricuspid regurgitant velocity is 2.17 m/s, and with an assumed right atrial pressure of 15 mmHg, the estimated right ventricular systolic pressure is 33.8 mmHg. Left Atrium: Left atrial size was normal in size. Right Atrium: Right atrial size was normal in size. Pericardium: Trivial pericardial effusion  is present. Mitral Valve: The mitral valve is grossly normal. Trivial mitral valve regurgitation. No evidence of mitral valve stenosis. Tricuspid Valve: The tricuspid valve is grossly normal. Tricuspid valve regurgitation is mild . No evidence of tricuspid stenosis. Aortic Valve: The aortic valve is grossly normal. Aortic valve regurgitation is not visualized. No aortic stenosis is present. Pulmonic Valve: The pulmonic valve was grossly normal. Pulmonic valve regurgitation is not visualized. Aorta: The aortic root and ascending aorta are structurally normal, with no evidence of dilitation. Venous: The inferior vena cava is dilated in size with less than 50% respiratory variability, suggesting right atrial pressure of 15 mmHg. IAS/Shunts: The atrial septum is grossly normal. Additional Comments: A device lead is visualized in the right atrium and right ventricle.  LEFT VENTRICLE PLAX  2D LVIDd:         5.30 cm  Diastology LVIDs:         3.60 cm  LV e' medial:    7.83 cm/s LV PW:         1.30 cm  LV E/e' medial:  12.4 LV IVS:        1.30 cm  LV e' lateral:   10.20 cm/s LVOT diam:     2.10 cm  LV E/e' lateral: 9.5 LV SV:         48 LV SV Index:   21 LVOT Area:     3.46 cm  RIGHT VENTRICLE            IVC RV Basal diam:  3.70 cm    IVC diam: 2.55 cm RV Mid diam:    2.80 cm RV S prime:     6.64 cm/s TAPSE (M-mode): 1.6 cm LEFT ATRIUM              Index       RIGHT ATRIUM           Index LA diam:        5.20 cm  2.25 cm/m  RA Area:     25.00 cm LA Vol (A2C):   101.0 ml 43.65 ml/m RA Volume:   82.70 ml  35.74 ml/m LA Vol (A4C):   65.9 ml  28.48 ml/m LA Biplane Vol: 81.4 ml  35.18 ml/m  AORTIC VALVE LVOT Vmax:   69.80 cm/s LVOT Vmean:  48.200 cm/s LVOT VTI:    0.138 m  AORTA Ao Root diam: 3.80 cm Ao Asc diam:  3.70 cm MITRAL VALVE               TRICUSPID VALVE MV Area (PHT): 3.82 cm    TR Peak grad:   18.8 mmHg MV Decel Time: 199 msec    TR Vmax:        217.00 cm/s MV E velocity: 96.75 cm/s                            SHUNTS                            Systemic VTI:  0.14 m                            Systemic Diam: 2.10 cm Lennie Odor MD Electronically signed by Lennie Odor MD Signature Date/Time: 10/22/2020/2:57:58 PM    Final        Subjective: Patient is feeling better, dyspnea and  lower extremity edema continue to improve, no chest pain.,   Discharge Exam: Vitals:   10/27/20 0730 10/27/20 0927  BP: 118/82 105/63  Pulse: 70 70  Resp:    Temp:    SpO2: 97%    Vitals:   10/26/20 1927 10/27/20 0352 10/27/20 0730 10/27/20 0927  BP: (!) 103/58 109/70 118/82 105/63  Pulse: 72 70 70 70  Resp: 16 18    Temp: 97.6 F (36.4 C) 97.6 F (36.4 C)    TempSrc: Oral Oral    SpO2: 96% 95% 97%   Weight:  104.8 kg    Height:        General: Not in pain or dyspnea.  Neurology: Awake and alert, non focal  E ENT: no pallor, no icterus, oral mucosa moist Cardiovascular: No JVD.  S1-S2 present, rhythmic, no gallops, rubs, or murmurs. Trace bilateral lower extremity edema. Pulmonary: positive breath sounds bilaterally, with no wheezing, rhonchi or rales. Gastrointestinal. Abdomen soft and non tender Skin. No rashes Musculoskeletal: no joint deformities   The results of significant diagnostics from this hospitalization (including imaging, microbiology, ancillary and laboratory) are listed below for reference.     Microbiology: Recent Results (from the past 240 hour(s))  Resp Panel by RT-PCR (Flu A&B, Covid) Nasopharyngeal Swab     Status: None   Collection Time: 10/21/20  6:40 PM   Specimen: Nasopharyngeal Swab; Nasopharyngeal(NP) swabs in vial transport medium  Result Value Ref Range Status   SARS Coronavirus 2 by RT PCR NEGATIVE NEGATIVE Final    Comment: (NOTE) SARS-CoV-2 target nucleic acids are NOT DETECTED.  The SARS-CoV-2 RNA is generally detectable in upper respiratory specimens during the acute phase of infection. The lowest concentration of SARS-CoV-2 viral copies this assay can detect is 138 copies/mL. A negative result does not preclude SARS-Cov-2 infection and should not be used as the sole basis for treatment or other patient management decisions. A negative result may occur with  improper specimen collection/handling, submission of specimen other than nasopharyngeal swab, presence of viral mutation(s) within the areas targeted by this assay, and inadequate number of viral copies(<138 copies/mL). A negative result must be combined with clinical observations, patient history, and epidemiological information. The expected result is Negative.  Fact Sheet for Patients:  BloggerCourse.comhttps://www.fda.gov/media/152166/download  Fact Sheet for Healthcare Providers:  SeriousBroker.ithttps://www.fda.gov/media/152162/download  This test is no t yet approved or cleared by the [place] FDA and  has been authorized for detection and/or diagnosis of SARS-CoV-2 by FDA under an  Emergency Use Authorization (EUA). This EUA will remain  in effect (meaning this test can be used) for the duration of the COVID-19 declaration under Section 564(b)(1) of the Act, 21 U.S.C.section 360bbb-3(b)(1), unless the authorization is terminated  or revoked sooner.       Influenza A by PCR NEGATIVE NEGATIVE Final   Influenza B by PCR NEGATIVE NEGATIVE Final    Comment: (NOTE) The Xpert Xpress SARS-CoV-2/FLU/RSV plus assay is intended as an aid in the diagnosis of influenza from Nasopharyngeal swab specimens and should not be used as a sole basis for treatment. Nasal washings and aspirates are unacceptable for Xpert Xpress SARS-CoV-2/FLU/RSV testing.  Fact Sheet for Patients: BloggerCourse.comhttps://www.fda.gov/media/152166/download  Fact Sheet for Healthcare Providers: SeriousBroker.ithttps://www.fda.gov/media/152162/download  This test is not yet approved or cleared by the [place] FDA and has been authorized for detection and/or diagnosis of SARS-CoV-2 by FDA under an Emergency Use Authorization (EUA). This EUA will remain in effect (meaning this test can be used) for the duration of  the COVID-19 declaration under Section 564(b)(1) of the Act, 21 U.S.C. section 360bbb-3(b)(1), unless the authorization is terminated or revoked.  Performed at [facility], [address] [place], [place] [address]      Labs: BNP (last 3 results) Recent Labs    10/21/20 1251  BNP 267.2*   Basic Metabolic Panel: Recent Labs  Lab 10/21/20 2232 10/22/20 0152 10/23/20 0318 10/24/20 0351 10/25/20 0416 10/26/20 0247 10/27/20 0314  NA  --    < > 140 139 138 138 136  K  --    < > 3.6 3.4* 3.9 3.4* 3.6  CL  --    < > 102 102 101 101 101  CO2  --    < > 30 28 29 28 26   GLUCOSE  --    < > 115* 117* 134* 119* 133*  BUN  --    < > 18 21 19  25* 29*  CREATININE  --    < > 1.03 1.02 1.13 1.28* 1.03  CALCIUM  --    < > 9.4 9.2 9.7 9.5 9.5  MG 2.3  --   --  2.1  --  2.4  --    < > = values in this  interval not displayed.   Liver Function Tests: Recent Labs  Lab 10/21/20 1240 10/22/20 0152  AST 27 22  ALT 29 25  ALKPHOS 120 110  BILITOT 1.1 1.3*  PROT 7.2 6.5  ALBUMIN 4.0 3.7   Recent Labs  Lab 10/21/20 1240  LIPASE 45   No results for input(s): AMMONIA in the last 168 hours. CBC: Recent Labs  Lab 10/21/20 1240 10/22/20 0152  WBC 5.3 5.0  HGB 13.3 12.6*  HCT 42.9 39.7  MCV 87.7 85.4  PLT 149* 132*   Cardiac Enzymes: No results for input(s): CKTOTAL, CKMB, CKMBINDEX, TROPONINI in the last 168 hours. BNP: Invalid input(s): POCBNP CBG: Recent Labs  Lab 10/26/20 0537 10/26/20 1130 10/26/20 1625 10/26/20 2059 10/27/20 0435  GLUCAP 147* 181* 119* 140* 145*   D-Dimer No results for input(s): DDIMER in the last 72 hours. Hgb A1c No results for input(s): HGBA1C in the last 72 hours. Lipid Profile No results for input(s): CHOL, HDL, LDLCALC, TRIG, CHOLHDL, LDLDIRECT in the last 72 hours. Thyroid function studies Recent Labs    10/25/20 0416  TSH 1.179   Anemia work up No results for input(s): VITAMINB12, FOLATE, FERRITIN, TIBC, IRON, RETICCTPCT in the last 72 hours. Urinalysis    Component Value Date/Time   COLORURINE STRAW (A) 10/21/2020 1251   APPEARANCEUR CLEAR 10/21/2020 1251   LABSPEC 1.006 10/21/2020 1251   PHURINE 7.0 10/21/2020 1251   GLUCOSEU NEGATIVE 10/21/2020 1251   HGBUR NEGATIVE 10/21/2020 1251   BILIRUBINUR NEGATIVE 10/21/2020 1251   KETONESUR NEGATIVE 10/21/2020 1251   PROTEINUR NEGATIVE 10/21/2020 1251   NITRITE NEGATIVE 10/21/2020 1251   LEUKOCYTESUR NEGATIVE 10/21/2020 1251   Sepsis Labs Invalid input(s): PROCALCITONIN,  WBC,  LACTICIDVEN Microbiology Recent Results (from the past 240 hour(s))  Resp Panel by RT-PCR (Flu A&B, Covid) Nasopharyngeal Swab     Status: None   Collection Time: 10/21/20  6:40 PM   Specimen: Nasopharyngeal Swab; Nasopharyngeal(NP) swabs in vial transport medium  Result Value Ref Range Status    SARS Coronavirus 2 by RT PCR NEGATIVE NEGATIVE Final    Comment: (NOTE) SARS-CoV-2 target nucleic acids are NOT DETECTED.  The SARS-CoV-2 RNA is generally detectable in upper respiratory specimens during the acute phase of infection.  The lowest concentration of SARS-CoV-2 viral copies this assay can detect is 138 copies/mL. A negative result does not preclude SARS-Cov-2 infection and should not be used as the sole basis for treatment or other patient management decisions. A negative result may occur with  improper specimen collection/handling, submission of specimen other than nasopharyngeal swab, presence of viral mutation(s) within the areas targeted by this assay, and inadequate number of viral copies(<138 copies/mL). A negative result must be combined with clinical observations, patient history, and epidemiological information. The expected result is Negative.  Fact Sheet for Patients:  BloggerCourse.com  Fact Sheet for Healthcare Providers:  SeriousBroker.it  This test is no t yet approved or cleared by the [place] FDA and  has been authorized for detection and/or diagnosis of SARS-CoV-2 by FDA under an Emergency Use Authorization (EUA). This EUA will remain  in effect (meaning this test can be used) for the duration of the COVID-19 declaration under Section 564(b)(1) of the Act, 21 U.S.C.section 360bbb-3(b)(1), unless the authorization is terminated  or revoked sooner.       Influenza A by PCR NEGATIVE NEGATIVE Final   Influenza B by PCR NEGATIVE NEGATIVE Final    Comment: (NOTE) The Xpert Xpress SARS-CoV-2/FLU/RSV plus assay is intended as an aid in the diagnosis of influenza from Nasopharyngeal swab specimens and should not be used as a sole basis for treatment. Nasal washings and aspirates are unacceptable for Xpert Xpress SARS-CoV-2/FLU/RSV testing.  Fact Sheet for  Patients: BloggerCourse.com  Fact Sheet for Healthcare Providers: SeriousBroker.it  This test is not yet approved or cleared by the [place] FDA and has been authorized for detection and/or diagnosis of SARS-CoV-2 by FDA under an Emergency Use Authorization (EUA). This EUA will remain in effect (meaning this test can be used) for the duration of the COVID-19 declaration under Section 564(b)(1) of the Act, 21 U.S.C. section 360bbb-3(b)(1), unless the authorization is terminated or revoked.  Performed at [facility], [address], [place], [place] [address]      Time coordinating discharge: 45 minutes  SIGNED:   Coralie Keens, MD  [facility] 10/27/2020, 10:01 AM

## 2020-10-27 NOTE — TOC Benefit Eligibility Note (Signed)
Transition of Care [facility]) Benefit Eligibility Note    Patient Details  Name: Chad Hahn MRN: [id number] Date of Birth: 1945-07-19   Medication/Dose: ELIQUIS  5 MG BID  CO-PAY- $346.76       ENTRESTO  24-26 MG BID  COVER- YES   CO-PAY-  $366.01  TIER- 3 DRUG    P/A-NO  Covered?: Yes  Tier: 3 Drug  Prescription Coverage Preferred Pharmacy: [facility] with Person/Company/Phone Number:: [name]  @  [employer] #  [phone]  Co-Pay: $ 346.76  Prior Approval: No  Deductible: Unmet (OUT-OF-POCKET:UNMET)  Additional Notes: JARDIANCE  10 MG DAILY   COVER- YES   CO-PAY-$ 355.19   TIER- 3 DRUG    P/A -NO    [name] Phone Number: 10/27/2020, 10:02 AM

## 2020-10-27 NOTE — Plan of Care (Signed)
  Problem: Education: Goal: Ability to demonstrate management of disease process will improve Outcome: Progressing   Problem: Education: Goal: Ability to demonstrate management of disease process will improve Outcome: Progressing Goal: Ability to verbalize understanding of medication therapies will improve Outcome: Progressing Goal: Individualized Educational Video(s) Outcome: Progressing   Problem: Activity: Goal: Capacity to carry out activities will improve Outcome: Progressing   Problem: Cardiac: Goal: Ability to achieve and maintain adequate cardiopulmonary perfusion will improve Outcome: Progressing   Problem: Education: Goal: Knowledge of General Education information will improve Description: Including pain rating scale, medication(s)/side effects and non-pharmacologic comfort measures Outcome: Progressing

## 2020-10-27 NOTE — TOC Transition Note (Addendum)
Transition of Care [facility]) - CM/SW Discharge Note   Patient Details  Name: Chad Hahn MRN: [id number] Date of Birth: 1945-06-11  Transition of Care [facility]) CM/SW Contact:  Leone Haven, RN Phone Number: 10/27/2020, 11:43 AM   Clinical Narrative:    Patient is for dc today, NCM discussed the copay amt for his meds with him, he states he can afford the copay amt, he is an Art gallery manager.  He has transport home.  He states he has a cpap at home but need to make sure it is working.  He states he had two sleep studies in the past in 1024 and 2010.  NCM asked who supplied it for him he states [facility].  NCM informed him to see if he can get them to replace it. If that do not then he will have to have another sleep study in order  to get it from a company here. Also patient has follow up with [facility], he states he will start out with them and then he will make his follow up for a PCP that he wants to look into himself.   Final next level of care: Home/Self Care Barriers to Discharge: No Barriers Identified   Patient Goals and CMS Choice Patient states their goals for this hospitalization and ongoing recovery are:: return home   Choice offered to / list presented to : NA  Discharge Placement                       Discharge Plan and Services                  DME Agency: NA       HH Arranged: NA          Social Determinants of Health (SDOH) Interventions     Readmission Risk Interventions No flowsheet data found.

## 2020-10-27 NOTE — Progress Notes (Signed)
Heart Failure Stewardship Pharmacist Progress Note   PCP: System, Provider Not In PCP-Cardiologist: None    HPI:  75 yo M with PMH of CHF, ICD in 2013, CAD s/p CABG in 1990 (redo in 1996) and PCIs, DM, HTN, OSA, and BPH. He presented to the ED on 6/29 with shortness of breath, orthopnea, and chest pressure. CXR without acute cardiopulmonary abnormalities or signs of CHF. An ECHO was done on 6/30 and LVEF was 20-25%.  Discharge HF Medications: Furosemide 60 mg daily Carvedilol 25 mg BID Entresto 24/26 mg BID Spironolactone 25 mg daily Jardiance 10 mg daily  Prior to admission HF Medications: Furosemide 20 mg daily Carvedilol 25 mg BID Lisinopril 10 mg daily  Pertinent Lab Values: Serum creatinine 1.03, BUN 29, Potassium 3.6, Sodium 136, BNP 267.2  Vital Signs: Weight: 231 lbs (admission weight: 248 lbs) Blood pressure: 110/70s  Heart rate: 80s   Medication Assistance / Insurance Benefits Check: Does the patient have prescription insurance?  Yes Type of insurance plan: Medicare + Humana supplement  Does the patient qualify for medication assistance through manufacturers or grants?   Yes Eligible grants and/or patient assistance programs: Valla Leaver Medication assistance applications in progress: none - will complete at HF TOC visit  Medication assistance applications approved: none Approved medication assistance renewals will be completed by: pending  Outpatient Pharmacy:  Prior to admission outpatient pharmacy: [facility] Is the patient willing to use [facility] TOC pharmacy at discharge? Yes Is the patient willing to transition their outpatient pharmacy to utilize a [facility] outpatient pharmacy?   Pending    Assessment/Plan: 1. Acute on chronic systolic CHF (EF 75-91%), due to known likely ischemic cardiomyopathy. NYHA class II symptoms. - Continue furosemide 60 mg daily at discharge - Continue carvedilol 25 mg BID - Continue Entresto 24/26 mg BID - Continue  spironolactone 25 mg daily - Continue Jardiance 10 mg daily   2) Patient assistance: Sherryll Burger and Jardiance copay cards to be provided from [facility] discharge pharmacy  - Copays high as patient is in coverage gap (donut hole). Will complete patient assistance applications/apply for grants during HF TOC appt - HF TOC appt made for 7/11   Sharen Hones, PharmD, BCPS Heart Failure Stewardship Pharmacist Phone [phone]

## 2020-10-28 NOTE — Progress Notes (Signed)
[facility] Transitions of Care Clinic  PCP: None Primary Cardiologist:   HPI:  Chad Hahn is a 75 y.o.  male  with a PMH significant for chronic systolic CHF, St. Jude CRT-D 2013 with generator change 12/2018, CAD s/p CABG with redo and PCIs, DM, HTN, OSA on CPAP, BPH  On chart review primarily from last admission as the patient has primarily lived in the [address] his cardiac history began at the age of 65 where he rec'd cabg at age 57, subsequent redo cabg 1996, multiple repeat LHC procedures since then and apparently received 2 stents in 2012.  CRT-D placement in 2013.  He reported last echo before his admission [facility] was about one year ago and his EF at that time was 30%.  He was on ASA, carvedilol, Plavix, Lasix, lisinopril and Crestor as OP prior to staying in [place].  He presented to [facility] hospital with several weeks of worsening dyspnea on exertion, LE edema, chest pressure.  He was noted to have elevated bnp at 267 with normal troponin values. He was found to be in atrial flutter but with normal rate.  He was started on IV lasix, his device was interrogated and revealed that he had been in atrial flutter since 06/2020 and had a subsequent continued decrease in thoracic impedance since then.  Had a TTE on 10/22/20 with EF 20-25%, akinetic/aneurysmal apex, mild LVH, dilated IVC.  He was continued on IV lasix with wt trend of 248>231.  He was also transitioned from ace to arb to entresto 24/26, carvedilol 25 BID continued, jardiance 10 added, spiro 25 added.  He was started on eliquis with plans for outpatient cardioversion after appropriate window of AC.    Since hospitalization, has been having symptomatic orthostatic hypotension, daughter is a cardiac nurse and taking manual bp at home.  BP running in the 90's systolic.  Breathing has been doing well, able to go up and down stairs, went to the museum yesterday and had no trouble.  Urine output has dropped off somewhat on  the lasix.  Restricting fluid to 2L or less daily.  Cutting back on salt as well.  Denies chest pain, using cpap, not able to lie flat at night, no PND.     ROS: All systems negative except as listed in HPI, PMH and Problem List.  SH:  Social History   Socioeconomic History   Marital status: Married    Spouse name: Not on file   Number of children: Not on file   Years of education: Not on file   Highest education level: Not on file  Occupational History   Not on file  Tobacco Use   Smoking status: Never   Smokeless tobacco: Never  Vaping Use   Vaping Use: Never used  Substance and Sexual Activity   Alcohol use: Not Currently   Drug use: Never   Sexual activity: Not on file  Other Topics Concern   Not on file  Social History Narrative   Not on file   Social Determinants of Health   Financial Resource Strain: Not on file  Food Insecurity: Not on file  Transportation Needs: Not on file  Physical Activity: Not on file  Stress: Not on file  Social Connections: Not on file  Intimate Partner Violence: Not on file    FH:  Family History  Problem Relation Age of Onset   Heart disease Mother    Heart disease Paternal Kateri Mc     Past  Medical History:  Diagnosis Date   BPH (benign prostatic hyperplasia)    CAD (coronary artery disease)    CABG age 64 and redo 1996, multiple PCIs as well   Chronic systolic CHF (congestive heart failure) (HCC)    DM2 (diabetes mellitus, type 2) (HCC)    Hypertension    ICD (implantable cardioverter-defibrillator) in place    St jude   Obesity    OSA on CPAP     Current Outpatient Medications  Medication Sig Dispense Refill   apixaban (ELIQUIS) 5 MG TABS tablet Take 1 tablet (5 mg total) by mouth 2 (two) times daily. 60 tablet 0   carvedilol (COREG) 25 MG tablet Take 25 mg by mouth 2 (two) times daily with a meal.     clopidogrel (PLAVIX) 75 MG tablet Take 75 mg by mouth every morning.     empagliflozin (JARDIANCE) 10 MG TABS tablet  Take 1 tablet (10 mg total) by mouth daily. 30 tablet 0   finasteride (PROSCAR) 5 MG tablet Take 5 mg by mouth every evening.     furosemide (LASIX) 20 MG tablet Take 3 tablets (60 mg total) by mouth daily. 90 tablet 0   metFORMIN (GLUCOPHAGE-XR) 500 MG 24 hr tablet Take 500 mg by mouth daily with breakfast.     nitroGLYCERIN (NITROSTAT) 0.4 MG SL tablet Place 0.4 mg under the tongue every 5 (five) minutes as needed for chest pain.     potassium chloride SA (KLOR-CON) 20 MEQ tablet Take 1 tablet (20 mEq total) by mouth daily for 30 doses. 30 tablet 0   rosuvastatin (CRESTOR) 40 MG tablet Take 40 mg by mouth at bedtime.     sacubitril-valsartan (ENTRESTO) 24-26 MG Take 1 tablet by mouth 2 (two) times daily. 60 tablet 0   spironolactone (ALDACTONE) 25 MG tablet Take 1 tablet (25 mg total) by mouth daily. 30 tablet 0   tamsulosin (FLOMAX) 0.4 MG CAPS capsule Take 0.4 mg by mouth at bedtime.     No current facility-administered medications for this encounter.    Vitals:   11/02/20 0907  BP: (!) 90/50  Pulse: 70  SpO2: 97%  Weight: 105.6 kg (232 lb 12.8 oz)    PHYSICAL EXAM: Cardiac: JVD flat, normal rate and rhythm, clear s1 and s2, no murmurs, rubs or gallops, no LE edema Pulmonary: CTAB, not in distress Abdominal: non distended abdomen, soft and nontender Psych: Alert, conversant, in good spirits  ECG   A flutter with V paced rhythm with rate of 70  ASSESSMENT & PLAN: Chronic Systolic CHF: -extensive CAD history as outlined above, based on hx likely at least a component of ischemic cardiomyopathy.  Per pt received first CABG at age 69 -EF reportedly 30% in 2021 -Seemed to decompensate severely once in a flutter 06/2020 given correlation with thoracic impedance decreasing leading up to admission -09/2020 ECHO EF 20-25%, akinetic/aneurysmal apex, mild LVH, dilated IVC -NYHA Class II, dry on exam -Expect orthostasis from dehydration will start by switching lasix from 60mg  daily to PRN  from daily, stop potassium supplement -Continue Entresto 24/26, Spiro 25, Jardiance 10, Carvedilol 25 BID -Discussed with his daughter if still having symptomatic hypotension after 72hrs of rehydration would decrease carvedilol to 12.5 BID -Agree that the first step is getting him out of flutter, plan is for DCCV in 30 days -Has CRT-D,  QRS wide at 172 but does appear to be BIV pacing ? If this could be optimized further -May benefit from repeat ischemic eval as  well going forward  CAD: -extensive hx as above, CABG 37 with redo and multiple PCIs last in 2012 when he rec'd 2 DES  -no s/s of chest pain, no bleeding  Aflutter: -remains in a flutter -continue eliquis, carvedilol  OSA: -Using cpap nightly   Follow up with [facility] He was encouraged to get a PCP in [place] also

## 2020-11-02 ENCOUNTER — Ambulatory Visit (HOSPITAL_COMMUNITY)
Admit: 2020-11-02 | Discharge: 2020-11-02 | Disposition: A | Discharge status: Home / Self Care | Payer: Medicare Other | Attending: Internal Medicine | Admitting: Internal Medicine

## 2020-11-02 ENCOUNTER — Encounter (HOSPITAL_COMMUNITY): Payer: Self-pay

## 2020-11-02 ENCOUNTER — Other Ambulatory Visit: Payer: Self-pay

## 2020-11-02 VITALS — BP 90/50 | HR 70 | Wt 232.8 lb

## 2020-11-02 DIAGNOSIS — Z7901 Long term (current) use of anticoagulants: Secondary | ICD-10-CM | POA: Insufficient documentation

## 2020-11-02 DIAGNOSIS — Z7984 Long term (current) use of oral hypoglycemic drugs: Secondary | ICD-10-CM | POA: Diagnosis not present

## 2020-11-02 DIAGNOSIS — I110 Hypertensive heart disease with heart failure: Secondary | ICD-10-CM | POA: Diagnosis not present

## 2020-11-02 DIAGNOSIS — Z8249 Family history of ischemic heart disease and other diseases of the circulatory system: Secondary | ICD-10-CM | POA: Insufficient documentation

## 2020-11-02 DIAGNOSIS — I5042 Chronic combined systolic (congestive) and diastolic (congestive) heart failure: Secondary | ICD-10-CM

## 2020-11-02 DIAGNOSIS — E669 Obesity, unspecified: Secondary | ICD-10-CM | POA: Insufficient documentation

## 2020-11-02 DIAGNOSIS — Z955 Presence of coronary angioplasty implant and graft: Secondary | ICD-10-CM | POA: Diagnosis not present

## 2020-11-02 DIAGNOSIS — I4819 Other persistent atrial fibrillation: Secondary | ICD-10-CM | POA: Diagnosis not present

## 2020-11-02 DIAGNOSIS — I5022 Chronic systolic (congestive) heart failure: Secondary | ICD-10-CM | POA: Insufficient documentation

## 2020-11-02 DIAGNOSIS — I2510 Atherosclerotic heart disease of native coronary artery without angina pectoris: Secondary | ICD-10-CM | POA: Diagnosis not present

## 2020-11-02 DIAGNOSIS — E119 Type 2 diabetes mellitus without complications: Secondary | ICD-10-CM | POA: Diagnosis not present

## 2020-11-02 DIAGNOSIS — Z79899 Other long term (current) drug therapy: Secondary | ICD-10-CM | POA: Insufficient documentation

## 2020-11-02 DIAGNOSIS — Z951 Presence of aortocoronary bypass graft: Secondary | ICD-10-CM | POA: Insufficient documentation

## 2020-11-02 DIAGNOSIS — I5040 Unspecified combined systolic (congestive) and diastolic (congestive) heart failure: Secondary | ICD-10-CM | POA: Diagnosis not present

## 2020-11-02 DIAGNOSIS — Z95810 Presence of automatic (implantable) cardiac defibrillator: Secondary | ICD-10-CM | POA: Diagnosis not present

## 2020-11-02 DIAGNOSIS — I4892 Unspecified atrial flutter: Secondary | ICD-10-CM | POA: Insufficient documentation

## 2020-11-02 DIAGNOSIS — Z7902 Long term (current) use of antithrombotics/antiplatelets: Secondary | ICD-10-CM | POA: Insufficient documentation

## 2020-11-02 DIAGNOSIS — G4733 Obstructive sleep apnea (adult) (pediatric): Secondary | ICD-10-CM | POA: Diagnosis not present

## 2020-11-02 LAB — BASIC METABOLIC PANEL
Anion gap: 9 (ref 5–15)
BUN: 32 mg/dL — ABNORMAL HIGH (ref 8–23)
CO2: 22 mmol/L (ref 22–32)
Calcium: 9.9 mg/dL (ref 8.9–10.3)
Chloride: 102 mmol/L (ref 98–111)
Creatinine, Ser: 1.25 mg/dL — ABNORMAL HIGH (ref 0.61–1.24)
GFR, Estimated: >60 mL/min (ref >60)
Glucose, Bld: 183 mg/dL — ABNORMAL HIGH (ref 70–99)
Potassium: 4.8 mmol/L (ref 3.5–5.1)
Sodium: 133 mmol/L — ABNORMAL LOW (ref 135–145)

## 2020-11-02 LAB — LIPID PANEL
Cholesterol: 111 mg/dL (ref 0–200)
HDL: 28 mg/dL — ABNORMAL LOW (ref >40)
LDL Cholesterol: 63 mg/dL (ref 0–99)
Total CHOL/HDL Ratio: 4 RATIO
Triglycerides: 98 mg/dL (ref <150)
VLDL: 20 mg/dL (ref 0–40)

## 2020-11-02 LAB — BRAIN NATRIURETIC PEPTIDE: B Natriuretic Peptide: 75.8 pg/mL (ref 0.0–100.0)

## 2020-11-02 NOTE — Patient Instructions (Addendum)
Change  Lasix to as needed  Stop Potassium  IF still hypotensive in 72 hours off Lasix  DECEASE Cardedilol to 12.5 mg Twice daily  Follow up with general cardiology,referral has been [facility] office will contact you for an appointment

## 2020-11-05 ENCOUNTER — Other Ambulatory Visit (HOSPITAL_COMMUNITY): Payer: Self-pay

## 2020-11-05 ENCOUNTER — Telehealth (HOSPITAL_COMMUNITY): Payer: Self-pay

## 2020-11-05 NOTE — Telephone Encounter (Signed)
Pharmacy Transitions of Care Follow-up Telephone Call  Date of discharge: 10/27/20  Discharge Diagnosis: acute on chronic systolic heart failure decompensation  How have you been since you were released from the hospital?  Patient is aware their blood glucose still needs work post discharge. They have been on warfarin in the past so they aware of side effects of blood thinners. Is about to run out of Jardiance due to partial fill but will contact Endocrinologist in [place] for new Rx to be sent to [facility] pharmacy. Other meds will last until return to [place].  Medication changes made at discharge:      START taking: Eliquis (apixaban)  Entresto (sacubitril-valsartan)  Jardiance (empagliflozin)  potassium chloride SA (KLOR-CON)  spironolactone (ALDACTONE)   CHANGE how you take: furosemide (LASIX)   STOP taking: aspirin 81 MG EC tablet  lisinopril 10 MG tablet (ZESTRIL)   Medication changes verified by the patient?  Yes    Medication Accessibility:  Home Pharmacy:  not discussed, in [place]  Was the patient provided with refills on discharged medications? No   Have all prescriptions been transferred from [facility] to home pharmacy? N/A   Is the patient able to afford medications? Patient has Humana Gold Plus    Medication Review:  APIXABAN (ELIQUIS)  Apixaban 5 mg BID initiated on 10/27/20.  - Discussed importance of taking medication around the same time everyday  - Advised patient of medications to avoid (NSAIDs, ASA)  - Educated that Tylenol (acetaminophen) will be the preferred analgesic to prevent risk of bleeding  - Emphasized importance of monitoring for signs and symptoms of bleeding (abnormal bruising, prolonged bleeding, nose bleeds, bleeding from gums, discolored urine, black tarry stools)  - Advised patient to alert all providers of anticoagulation therapy prior to starting a new medication or having a procedure    Follow-up Appointments:  PCP Hospital f/u appt  confirmed? Has follow up with PCP in [place] in August  [facility] f/u appt confirmed? Yes Scheduled to see Dr. Dan Humphreys on 11/20/20 @ Cardiology.   If their condition worsens, is the pt aware to call PCP or go to the Emergency Dept.? Yes  Final Patient Assessment: Patient doing well, aware of med changes, has follow up and knows to get refill upon return to [place].

## 2020-11-19 NOTE — H&P (View-Only) (Signed)
Office Visit    Patient Name: Chad Hahn Date of Encounter: 11/20/2020  PCP:  Aviva Kluver   [facility]  Cardiologist:  [name], MD ([place]) /  Advanced Practice Provider:  No care team member to display Electrophysiologist:  None    Chief Complaint    Chad Hahn is a 75 y.o. male with a hx of chronic systolic heart failure, Saint Jude CRT-D SD 2013 with generator change 12/2018, CAD s/p CABG with redo and PCI's, DM2, HTN, OSA on CPAP, BPH presents today for follow up of atrial flutter   Past Medical History    Past Medical History:  Diagnosis Date   BPH (benign prostatic hyperplasia)    CAD (coronary artery disease)    CABG age 43 and redo 1996, multiple PCIs as well   Chronic systolic CHF (congestive heart failure) (HCC)    DM2 (diabetes mellitus, type 2) (HCC)    Hypertension    ICD (implantable cardioverter-defibrillator) in place    St jude   Obesity    OSA on CPAP    Past Surgical History:  Procedure Laterality Date   BYPASS GRAFT ANGIOGRAPHY     CHOLECYSTECTOMY     PACEMAKER IMPLANT      Allergies  No Known Allergies  History of Present Illness    Chad Hahn is a 75 y.o. male with a hx of chronic systolic heart failure, Saint Jude CRT-D SD 2013 with generator change 12/2018, CAD s/p CABG with redo and PCI's, DM2, HTN, OSA on CPAP, BPH  last seen 11/02/2020 by heart and vascular transitions of care clinic.  Prior to recent admission he primarily worked on the [employer].  His cardiac history initiated at age 46 with CABG, subsequent redo CABG 1996, multiple repeat cardiac catheterizations with 2 stents in 2012.  CRT-D was placed in 2013.  Per his report echocardiogram prior to recent admission was about 1 year ago with EF 30%.  He was admitted June 2022 with worsening dyspnea.  He was in atrial flutter on admission.  On device interrogation he has been in atrial flutter since March 2022.  Echocardiogram 10/22/2020 with EF 20-25%,  akinetic/aneurysmal apex, mild LVH, dilated IVC.  He was treated with IV Lasix.  ACE was transitioned eventually to [facility].  Jardiance initiated as well as spironolactone.  He was started on Eliquis with plans for outpatient cardioversion.  He was seen in follow-up by transitions of care clinic 11/02/2020 noting orthostatic hypotension.  BP was running in the 90s systolic.  His Lasix was transitioned from 60 mg daily to as needed.  Potassium supplement was stopped.  He presents today for follow up with his daughter. Reports no shortness of breath at rest and dyspnea on exertion with more than usual activity. Reports no chest pain, pressure, or tightness. No edema, orthopnea, PND. Reports no palpitations.  He is hopeful his energy level will improve with restoration of normal sinus rhythm. He is appreciative of the cardiac care he is receiving. He does plan to return to [place] once his medical status is improved.  EKGs/Labs/Other Studies Reviewed:   The following studies were reviewed today:  TTE 10/22/20  1. 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 The myocardium in  this region appears thinned consistent with prior infarction. No apical  thrombus on contrast imaging. All other LV segments are hypokinetic. EF  severely reduced ~20-25%. Left  ventricular ejection fraction, by estimation, is 20 to 25%. The left  ventricle has  severely decreased function. The left ventricle demonstrates  regional wall motion abnormalities (see scoring diagram/findings for  description). There is mild concentric  left ventricular hypertrophy. Indeterminate diastolic filling due to E-A  fusion.   2. Right ventricular systolic function is severely reduced. The right  ventricular size is normal. There is normal pulmonary artery systolic  pressure. The estimated right ventricular systolic pressure is 33.8 mmHg.   3. The mitral valve is grossly normal. Trivial mitral valve  regurgitation. No evidence  of mitral stenosis.   4. The aortic valve is grossly normal. Aortic valve regurgitation is not  visualized. No aortic stenosis is present.   5. The inferior vena cava is dilated in size with <50% respiratory  variability, suggesting right atrial pressure of 15 mmHg.   Conclusion(s)/Recommendation(s): Findings consistent with ischemic  cardiomyopathy.    EKG:  EKG is  ordered today.  The ekg ordered today demonstrates V-paced 70 bpm with underlying atrial flutter.  Recent Labs: 10/22/2020: ALT 25; Hemoglobin 12.6; Platelets 132 10/25/2020: TSH 1.179 10/26/2020: Magnesium 2.4 11/02/2020: B Natriuretic Peptide 75.8; BUN 32; Creatinine, Ser 1.25; Potassium 4.8; Sodium 133  Recent Lipid Panel    Component Value Date/Time   CHOL 111 11/02/2020 1003   TRIG 98 11/02/2020 1003   HDL 28 (L) 11/02/2020 1003   CHOLHDL 4.0 11/02/2020 1003   VLDL 20 11/02/2020 1003   LDLCALC 63 11/02/2020 1003   Home Medications   Current Meds  Medication Sig   apixaban (ELIQUIS) 5 MG TABS tablet Take 1 tablet (5 mg total) by mouth 2 (two) times daily.   carvedilol (COREG) 25 MG tablet Take 25 mg by mouth 2 (two) times daily with a meal.   clopidogrel (PLAVIX) 75 MG tablet Take 75 mg by mouth every morning.   finasteride (PROSCAR) 5 MG tablet Take 5 mg by mouth every evening.   furosemide (LASIX) 20 MG tablet Take 3 tablets (60 mg total) by mouth daily.   metFORMIN (GLUCOPHAGE-XR) 500 MG 24 hr tablet Take 500 mg by mouth 2 (two) times daily.   nitroGLYCERIN (NITROSTAT) 0.4 MG SL tablet Place 0.4 mg under the tongue every 5 (five) minutes as needed for chest pain.   potassium chloride SA (KLOR-CON) 20 MEQ tablet Take 1 tablet (20 mEq total) by mouth daily for 30 doses.   rosuvastatin (CRESTOR) 40 MG tablet Take 40 mg by mouth at bedtime.   sacubitril-valsartan (ENTRESTO) 24-26 MG Take 1 tablet by mouth 2 (two) times daily.   spironolactone (ALDACTONE) 25 MG tablet Take 1 tablet (25 mg total) by mouth daily.      Review of Systems      All other systems reviewed and are otherwise negative except as noted above.  Physical Exam    VS:  BP 103/72   Pulse 70   Ht 5\' 11"  (1.803 m)   Wt 230 lb (104.3 kg)   SpO2 98%   BMI 32.08 kg/m  , BMI Body mass index is 32.08 kg/m.  Wt Readings from Last 3 Encounters:  11/20/20 230 lb (104.3 kg)  11/02/20 232 lb 12.8 oz (105.6 kg)  10/27/20 231 lb (104.8 kg)     GEN: Well nourished, well developed, in no acute distress. HEENT: normal. Neck: Supple, no JVD, carotid bruits, or masses. Cardiac: RRR, no murmurs, rubs, or gallops. No clubbing, cyanosis, edema.  Radials/PT 2+ and equal bilaterally.  Respiratory:  Respirations regular and unlabored, clear to auscultation bilaterally. GI: Soft, nontender, nondistended. MS: No deformity or atrophy.  Skin: Warm and dry, no rash. Neuro:  Strength and sensation are intact. Psych: Normal affect.  Assessment & Plan    HFrEF- Euvolemic and well compensated on exam. GDMT includes Entresto, Coreg, Lasix, Spironolactone, Jardiance. Jardiance samples today. Low salt diet and fluid restriction encouraged.   S/p CRT-D- Follows with with EP in [place]. We discussed establishing with EP with our group if he plans to stay in [place] long term. At this point, anticipates staying until cardiac function  CAD s/p CABG with redo CABG and multiple PCI's - Reports no chest pain, pressure, tightness. EKG with no acute ST/T wave changes. If exertional dyspnea and exercise tolerance does not improve with restoration of NSR consider ischemic evaluation. GDMT includes Jardiance, Clopidogrel, Rosuvastatin. No aspirin due to anticoagulation.   Atrial flutter /anticoagulation - Still in atrial flutter by EKG today. He reports dyspnea on exertion and fatigue. He is interested in pursuing cardioversion. He reports no missed doses of Eliquis.   Shared Decision Making/Informed Consent{ The risks (stroke, cardiac arrhythmias rarely resulting  in the need for a temporary or permanent pacemaker, skin irritation or burns and complications associated with conscious sedation including aspiration, arrhythmia, respiratory failure and death), benefits (restoration of normal sinus rhythm) and alternatives of a direct current cardioversion were explained in detail to Mr. Hennington and he agrees to proceed.   OSA - CPAP compliance encouraged.   Disposition: Follow up 1 week after DCCV.  Signed, Alver Sorrow, NP 11/20/2020, 10:05 AM [facility]

## 2020-11-19 NOTE — Progress Notes (Signed)
Office Visit    Patient Name: Chad Hahn Date of Encounter: 11/20/2020  PCP:  Aviva Kluver   [facility]  Cardiologist:  [name], MD ([place]) /  Advanced Practice Provider:  No care team member to display Electrophysiologist:  None    Chief Complaint    Chad Hahn is a 75 y.o. male with a hx of chronic systolic heart failure, Saint Jude CRT-D SD 2013 with generator change 12/2018, CAD s/p CABG with redo and PCI's, DM2, HTN, OSA on CPAP, BPH presents today for follow up of atrial flutter   Past Medical History    Past Medical History:  Diagnosis Date   BPH (benign prostatic hyperplasia)    CAD (coronary artery disease)    CABG age 43 and redo 1996, multiple PCIs as well   Chronic systolic CHF (congestive heart failure) (HCC)    DM2 (diabetes mellitus, type 2) (HCC)    Hypertension    ICD (implantable cardioverter-defibrillator) in place    St jude   Obesity    OSA on CPAP    Past Surgical History:  Procedure Laterality Date   BYPASS GRAFT ANGIOGRAPHY     CHOLECYSTECTOMY     PACEMAKER IMPLANT      Allergies  No Known Allergies  History of Present Illness    Chad Hahn is a 75 y.o. male with a hx of chronic systolic heart failure, Saint Jude CRT-D SD 2013 with generator change 12/2018, CAD s/p CABG with redo and PCI's, DM2, HTN, OSA on CPAP, BPH  last seen 11/02/2020 by heart and vascular transitions of care clinic.  Prior to recent admission he primarily worked on the [employer].  His cardiac history initiated at age 46 with CABG, subsequent redo CABG 1996, multiple repeat cardiac catheterizations with 2 stents in 2012.  CRT-D was placed in 2013.  Per his report echocardiogram prior to recent admission was about 1 year ago with EF 30%.  He was admitted June 2022 with worsening dyspnea.  He was in atrial flutter on admission.  On device interrogation he has been in atrial flutter since March 2022.  Echocardiogram 10/22/2020 with EF 20-25%,  akinetic/aneurysmal apex, mild LVH, dilated IVC.  He was treated with IV Lasix.  ACE was transitioned eventually to [facility].  Jardiance initiated as well as spironolactone.  He was started on Eliquis with plans for outpatient cardioversion.  He was seen in follow-up by transitions of care clinic 11/02/2020 noting orthostatic hypotension.  BP was running in the 90s systolic.  His Lasix was transitioned from 60 mg daily to as needed.  Potassium supplement was stopped.  He presents today for follow up with his daughter. Reports no shortness of breath at rest and dyspnea on exertion with more than usual activity. Reports no chest pain, pressure, or tightness. No edema, orthopnea, PND. Reports no palpitations.  He is hopeful his energy level will improve with restoration of normal sinus rhythm. He is appreciative of the cardiac care he is receiving. He does plan to return to [place] once his medical status is improved.  EKGs/Labs/Other Studies Reviewed:   The following studies were reviewed today:  TTE 10/22/20  1. 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 The myocardium in  this region appears thinned consistent with prior infarction. No apical  thrombus on contrast imaging. All other LV segments are hypokinetic. EF  severely reduced ~20-25%. Left  ventricular ejection fraction, by estimation, is 20 to 25%. The left  ventricle has  severely decreased function. The left ventricle demonstrates  regional wall motion abnormalities (see scoring diagram/findings for  description). There is mild concentric  left ventricular hypertrophy. Indeterminate diastolic filling due to E-A  fusion.   2. Right ventricular systolic function is severely reduced. The right  ventricular size is normal. There is normal pulmonary artery systolic  pressure. The estimated right ventricular systolic pressure is 33.8 mmHg.   3. The mitral valve is grossly normal. Trivial mitral valve  regurgitation. No evidence  of mitral stenosis.   4. The aortic valve is grossly normal. Aortic valve regurgitation is not  visualized. No aortic stenosis is present.   5. The inferior vena cava is dilated in size with <50% respiratory  variability, suggesting right atrial pressure of 15 mmHg.   Conclusion(s)/Recommendation(s): Findings consistent with ischemic  cardiomyopathy.    EKG:  EKG is  ordered today.  The ekg ordered today demonstrates V-paced 70 bpm with underlying atrial flutter.  Recent Labs: 10/22/2020: ALT 25; Hemoglobin 12.6; Platelets 132 10/25/2020: TSH 1.179 10/26/2020: Magnesium 2.4 11/02/2020: B Natriuretic Peptide 75.8; BUN 32; Creatinine, Ser 1.25; Potassium 4.8; Sodium 133  Recent Lipid Panel    Component Value Date/Time   CHOL 111 11/02/2020 1003   TRIG 98 11/02/2020 1003   HDL 28 (L) 11/02/2020 1003   CHOLHDL 4.0 11/02/2020 1003   VLDL 20 11/02/2020 1003   LDLCALC 63 11/02/2020 1003   Home Medications   Current Meds  Medication Sig   apixaban (ELIQUIS) 5 MG TABS tablet Take 1 tablet (5 mg total) by mouth 2 (two) times daily.   carvedilol (COREG) 25 MG tablet Take 25 mg by mouth 2 (two) times daily with a meal.   clopidogrel (PLAVIX) 75 MG tablet Take 75 mg by mouth every morning.   finasteride (PROSCAR) 5 MG tablet Take 5 mg by mouth every evening.   furosemide (LASIX) 20 MG tablet Take 3 tablets (60 mg total) by mouth daily.   metFORMIN (GLUCOPHAGE-XR) 500 MG 24 hr tablet Take 500 mg by mouth 2 (two) times daily.   nitroGLYCERIN (NITROSTAT) 0.4 MG SL tablet Place 0.4 mg under the tongue every 5 (five) minutes as needed for chest pain.   potassium chloride SA (KLOR-CON) 20 MEQ tablet Take 1 tablet (20 mEq total) by mouth daily for 30 doses.   rosuvastatin (CRESTOR) 40 MG tablet Take 40 mg by mouth at bedtime.   sacubitril-valsartan (ENTRESTO) 24-26 MG Take 1 tablet by mouth 2 (two) times daily.   spironolactone (ALDACTONE) 25 MG tablet Take 1 tablet (25 mg total) by mouth daily.      Review of Systems      All other systems reviewed and are otherwise negative except as noted above.  Physical Exam    VS:  BP 103/72   Pulse 70   Ht 5\' 11"  (1.803 m)   Wt 230 lb (104.3 kg)   SpO2 98%   BMI 32.08 kg/m  , BMI Body mass index is 32.08 kg/m.  Wt Readings from Last 3 Encounters:  11/20/20 230 lb (104.3 kg)  11/02/20 232 lb 12.8 oz (105.6 kg)  10/27/20 231 lb (104.8 kg)     GEN: Well nourished, well developed, in no acute distress. HEENT: normal. Neck: Supple, no JVD, carotid bruits, or masses. Cardiac: RRR, no murmurs, rubs, or gallops. No clubbing, cyanosis, edema.  Radials/PT 2+ and equal bilaterally.  Respiratory:  Respirations regular and unlabored, clear to auscultation bilaterally. GI: Soft, nontender, nondistended. MS: No deformity or atrophy.  Skin: Warm and dry, no rash. Neuro:  Strength and sensation are intact. Psych: Normal affect.  Assessment & Plan    HFrEF- Euvolemic and well compensated on exam. GDMT includes Entresto, Coreg, Lasix, Spironolactone, Jardiance. Jardiance samples today. Low salt diet and fluid restriction encouraged.   S/p CRT-D- Follows with with EP in [place]. We discussed establishing with EP with our group if he plans to stay in [place] long term. At this point, anticipates staying until cardiac function  CAD s/p CABG with redo CABG and multiple PCI's - Reports no chest pain, pressure, tightness. EKG with no acute ST/T wave changes. If exertional dyspnea and exercise tolerance does not improve with restoration of NSR consider ischemic evaluation. GDMT includes Jardiance, Clopidogrel, Rosuvastatin. No aspirin due to anticoagulation.   Atrial flutter /anticoagulation - Still in atrial flutter by EKG today. He reports dyspnea on exertion and fatigue. He is interested in pursuing cardioversion. He reports no missed doses of Eliquis.   Shared Decision Making/Informed Consent{ The risks (stroke, cardiac arrhythmias rarely resulting  in the need for a temporary or permanent pacemaker, skin irritation or burns and complications associated with conscious sedation including aspiration, arrhythmia, respiratory failure and death), benefits (restoration of normal sinus rhythm) and alternatives of a direct current cardioversion were explained in detail to Mr. Hennington and he agrees to proceed.   OSA - CPAP compliance encouraged.   Disposition: Follow up 1 week after DCCV.  Signed, Alver Sorrow, NP 11/20/2020, 10:05 AM [facility]

## 2020-11-20 ENCOUNTER — Encounter (HOSPITAL_BASED_OUTPATIENT_CLINIC_OR_DEPARTMENT_OTHER): Payer: Self-pay | Admitting: Family

## 2020-11-20 ENCOUNTER — Other Ambulatory Visit: Payer: Self-pay

## 2020-11-20 ENCOUNTER — Ambulatory Visit (INDEPENDENT_AMBULATORY_CARE_PROVIDER_SITE_OTHER): Payer: Medicare Other | Admitting: Family

## 2020-11-20 VITALS — BP 103/72 | HR 70 | Ht 71.0 in | Wt 230.0 lb

## 2020-11-20 DIAGNOSIS — I483 Typical atrial flutter: Secondary | ICD-10-CM | POA: Diagnosis not present

## 2020-11-20 DIAGNOSIS — Z7901 Long term (current) use of anticoagulants: Secondary | ICD-10-CM | POA: Diagnosis not present

## 2020-11-20 DIAGNOSIS — I5022 Chronic systolic (congestive) heart failure: Secondary | ICD-10-CM

## 2020-11-20 DIAGNOSIS — I25118 Atherosclerotic heart disease of native coronary artery with other forms of angina pectoris: Secondary | ICD-10-CM

## 2020-11-20 DIAGNOSIS — Z95810 Presence of automatic (implantable) cardiac defibrillator: Secondary | ICD-10-CM

## 2020-11-20 MED ORDER — SACUBITRIL-VALSARTAN 24-26 MG PO TABS: 1.0000 | ORAL_TABLET | Freq: Two times a day (BID) | ORAL | 1 refills | Status: DC

## 2020-11-20 MED ORDER — SPIRONOLACTONE 25 MG PO TABS: 25.0000 mg | ORAL_TABLET | Freq: Every day | ORAL | 1 refills | Status: DC

## 2020-11-20 MED ORDER — CARVEDILOL 25 MG PO TABS: 12.5000 mg | ORAL_TABLET | Freq: Two times a day (BID) | ORAL | Status: DC

## 2020-11-20 MED ORDER — EMPAGLIFLOZIN 10 MG PO TABS: 10.0000 mg | ORAL_TABLET | Freq: Every day | ORAL | 1 refills | Status: DC

## 2020-11-20 MED ORDER — APIXABAN 5 MG PO TABS: 5.0000 mg | ORAL_TABLET | Freq: Two times a day (BID) | ORAL | 1 refills | Status: DC

## 2020-11-20 NOTE — Patient Instructions (Addendum)
Medication Instructions:  Continue your current medications.  *If you need a refill on your cardiac medications before your next appointment, please call your pharmacy*   Lab Work: Your physician recommends  lab work Tuesday, August 2nd or Wednesday, August 3rd at [facility] for BMP, CBC. You do not need to be fasting.    Testing/Procedures: Your EKG today showed atrial flutter with your pacemaker working well  Your physician has recommended that you have a Cardioversion (DCCV). Electrical Cardioversion uses a jolt of electricity to your heart either through paddles or wired patches attached to your chest. This is a controlled, usually prescheduled, procedure. Defibrillation is done under light anesthesia in the hospital, and you usually go home the day of the procedure. This is done to get your heart back into a normal rhythm. You are not awake for the procedure. Please see the instruction sheet given to you today.   Follow-Up: At [facility], you and your health needs are our priority.  As part of our continuing mission to provide you with exceptional heart care, we have created designated Provider Care Teams.  These Care Teams include your primary Cardiologist (physician) and Advanced Practice Providers (APPs -  Physician Assistants and Nurse Practitioners) who all work together to provide you with the care you need, when you need it.  We recommend signing up for the patient portal called "MyChart".  Sign up information is provided on this After Visit Summary.  MyChart is used to connect with patients for Virtual Visits (Telemedicine).  Patients are able to view lab/test results, encounter notes, upcoming appointments, etc.  Non-urgent messages can be sent to your provider as well.   To learn more about what you can do with MyChart, go to [email or web address].    Your next appointment:   1 week after cardioversion   Other Instructions  You are scheduled for a TEE/Cardioversion/TEE  Cardioversion on ____Tuesday 8/9/22_________ with Dr. Cristal Deer.  Please arrive at the [facility] (Main Entrance A) at [facility]: [address] [place], [place] [address] at ___9:00_______ am. (1 hour prior to procedure)  DIET: Nothing to eat or drink after midnight except a sip of water with medications (see medication instructions below)  FYI: For your safety, and to allow [place] to monitor your vital signs accurately during the surgery/procedure we request that   if you have artificial nails, gel coating, SNS etc. Please have those removed prior to your surgery/procedure. Not having the nail coverings /polish removed may result in cancellation or delay of your surgery/procedure.   Medication Instructions: Hold Furosemide and Spironolactone until after the procedure  Continue your anticoagulant: Eliquis You will need to continue your anticoagulant after your procedure until you  are told by your  Provider that it is safe to stop  You must have a responsible person to drive you home and stay in the waiting area during your procedure. Failure to do so could result in cancellation.  Bring your insurance cards.  *Special Note: Every effort is made to have your procedure done on time. Occasionally there are emergencies that occur at the hospital that may cause delays. Please be patient if a delay does occur.

## 2020-11-24 ENCOUNTER — Telehealth: Payer: Self-pay | Admitting: Cardiology

## 2020-11-24 DIAGNOSIS — I5022 Chronic systolic (congestive) heart failure: Secondary | ICD-10-CM

## 2020-11-24 MED ORDER — SACUBITRIL-VALSARTAN 24-26 MG PO TABS: 1.0000 | ORAL_TABLET | Freq: Two times a day (BID) | ORAL | 1 refills | Status: DC

## 2020-11-24 MED ORDER — SPIRONOLACTONE 25 MG PO TABS: 25.0000 mg | ORAL_TABLET | Freq: Every day | ORAL | 1 refills | Status: DC

## 2020-11-24 NOTE — Telephone Encounter (Signed)
7 day supply sent per patient request, waiting on mailorder

## 2020-11-24 NOTE — Telephone Encounter (Signed)
Stephanie with [employer] states the patient will not have enough medication to last until his mail order arrives. She would like to have a 10 day supply sent to his local pharmacy listed below.   *STAT* If patient is at the pharmacy, call can be transferred to refill team.   1. Which medications need to be refilled? (please list name of each medication and dose if known)  sacubitril-valsartan (ENTRESTO) 24-26 MG spironolactone (ALDACTONE) 25 MG tablet  2. Which pharmacy/location (including street and city if local pharmacy) is medication to be sent to? [id number] - [place] ([place]), [place] - [address]  3. Do they need a 30 day or 90 day supply?  10 day supply

## 2020-11-24 NOTE — Telephone Encounter (Signed)
*  STAT* If patient is at the pharmacy, call can be transferred to refill team.   1. Which medications need to be refilled? (please list name of each medication and dose if known) spironolactone (ALDACTONE) 25 MG tablet; sacubitril-valsartan (ENTRESTO) 24-26 MG  2. Which pharmacy/location (including street and city if local pharmacy) is medication to be sent to?  [id number] - [place] ([place]), [place] - [address]  3. Do they need a 30 day or 90 day supply? 30 for both

## 2020-11-25 ENCOUNTER — Other Ambulatory Visit (HOSPITAL_BASED_OUTPATIENT_CLINIC_OR_DEPARTMENT_OTHER): Payer: Self-pay | Admitting: Family

## 2020-11-25 ENCOUNTER — Telehealth (HOSPITAL_BASED_OUTPATIENT_CLINIC_OR_DEPARTMENT_OTHER): Payer: Self-pay | Admitting: Family

## 2020-11-25 LAB — CBC
Hematocrit: 47.1 % (ref 37.5–51.0)
Hemoglobin: 15.2 g/dL (ref 13.0–17.7)
MCH: 26.7 pg (ref 26.6–33.0)
MCHC: 32.3 g/dL (ref 31.5–35.7)
MCV: 83 fL (ref 79–97)
Platelets: 183 10*3/uL (ref 150–450)
RBC: 5.69 x10E6/uL (ref 4.14–5.80)
RDW: 17.1 % — ABNORMAL HIGH (ref 11.6–15.4)
WBC: 5.3 10*3/uL (ref 3.4–10.8)

## 2020-11-25 LAB — BASIC METABOLIC PANEL
BUN/Creatinine Ratio: 25 — ABNORMAL HIGH (ref 10–24)
BUN: 28 mg/dL — ABNORMAL HIGH (ref 8–27)
CO2: 22 mmol/L (ref 20–29)
Calcium: 9.5 mg/dL (ref 8.6–10.2)
Chloride: 97 mmol/L (ref 96–106)
Creatinine, Ser: 1.1 mg/dL (ref 0.76–1.27)
Glucose: 131 mg/dL — ABNORMAL HIGH (ref 65–99)
Potassium: 4.7 mmol/L (ref 3.5–5.2)
Sodium: 135 mmol/L (ref 134–144)
eGFR: 70 mL/min/{1.73_m2} (ref >59)

## 2020-11-25 NOTE — Telephone Encounter (Signed)
Patient returning call from the office about lab results. Please call back

## 2020-11-25 NOTE — Telephone Encounter (Signed)
Alver Sorrow, NP  11/25/2020  9:38 AM EDT      CBC with no evidence of anemia nor infection. Stable compared to previous.Stable kidney function. Normal electrolytes. Good result!   The patient has been notified of the result and verbalized understanding.  All questions (if any) were answered. Theresia Majors, RN 11/25/2020 1:31 PM

## 2020-11-26 NOTE — Telephone Encounter (Signed)
Called pt and he states that he is taking Entresto 24-26mg  BID and states that he is out of medication and pharmacy told him that he should have the this mail order Monday 11-30-20.  Called pharmacy and they state that they could give pt #8 tab to take until the mail order comes in but it will be $99.11. will call [employer] to see what the issue is.  Called pt back [employer] # [phone] BIN [id number] PCN [id number] Over-ride [phone].s/w Altha. Approved ref# [id number] #10 tab/5 days. Urgent request was approved.  Pharmacy notified. Pt notified.

## 2020-11-26 NOTE — Telephone Encounter (Signed)
Pt c/o medication issue:  1. Name of Medication:  sacubitril-valsartan (ENTRESTO) 24-26 MG  2. How are you currently taking this medication (dosage and times per day)?  As prescribed  3. Are you having a reaction (difficulty breathing--STAT)?  No   4. What is your medication issue?   Patient is following up. He states he took his last Entresto this morning and [facility] will not be able to distribute the medication until 11/30/20. He would like to know if he can take Lisinopril in place on the [address]. Please advise.

## 2020-11-30 ENCOUNTER — Telehealth: Payer: Self-pay | Admitting: Cardiology

## 2020-11-30 NOTE — Telephone Encounter (Signed)
Returned call to patient who states that he needs a note stating that he is having his cardioversion tomorrow and a contingent date on when he can return to work.Patient states he just wants to supply his job with a return date and a plan as patient states that he does not want to lose will lose his job. 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 Advised patient that I would forward message to Dr. Cristal Deer and her nurse for them to review and advise. Patient verbalized understanding.

## 2020-11-30 NOTE — Telephone Encounter (Signed)
Spoke with Hayley from [facility] who report they will be able to provide pt with a work note.

## 2020-11-30 NOTE — Telephone Encounter (Signed)
Patient has a procedure scheduled for 12/01/20 with Dr. Cristal Deer. He would like to know when he can return to work post-op. Please advise.

## 2020-12-01 ENCOUNTER — Ambulatory Visit (HOSPITAL_BASED_OUTPATIENT_CLINIC_OR_DEPARTMENT_OTHER): Payer: Medicare Other | Admitting: General Practice

## 2020-12-01 ENCOUNTER — Ambulatory Visit (HOSPITAL_COMMUNITY): Payer: Medicare Other | Admitting: Anesthesiology

## 2020-12-01 ENCOUNTER — Encounter (HOSPITAL_COMMUNITY)
Admission: RE | Disposition: A | Discharge status: Home / Self Care | Payer: Self-pay | Source: Home / Self Care | Attending: Cardiology

## 2020-12-01 ENCOUNTER — Ambulatory Visit (HOSPITAL_COMMUNITY)
Admission: RE | Admit: 2020-12-01 | Discharge: 2020-12-01 | Disposition: A | Discharge status: Home / Self Care | Payer: Medicare Other | Attending: Cardiology | Admitting: Cardiology

## 2020-12-01 ENCOUNTER — Encounter (HOSPITAL_COMMUNITY): Payer: Self-pay | Admitting: Cardiology

## 2020-12-01 DIAGNOSIS — E119 Type 2 diabetes mellitus without complications: Secondary | ICD-10-CM | POA: Insufficient documentation

## 2020-12-01 DIAGNOSIS — I5022 Chronic systolic (congestive) heart failure: Secondary | ICD-10-CM | POA: Insufficient documentation

## 2020-12-01 DIAGNOSIS — Z7901 Long term (current) use of anticoagulants: Secondary | ICD-10-CM | POA: Diagnosis not present

## 2020-12-01 DIAGNOSIS — I110 Hypertensive heart disease with heart failure: Secondary | ICD-10-CM | POA: Diagnosis not present

## 2020-12-01 DIAGNOSIS — Z7984 Long term (current) use of oral hypoglycemic drugs: Secondary | ICD-10-CM | POA: Diagnosis not present

## 2020-12-01 DIAGNOSIS — Z951 Presence of aortocoronary bypass graft: Secondary | ICD-10-CM | POA: Insufficient documentation

## 2020-12-01 DIAGNOSIS — I2510 Atherosclerotic heart disease of native coronary artery without angina pectoris: Secondary | ICD-10-CM | POA: Diagnosis not present

## 2020-12-01 DIAGNOSIS — I4892 Unspecified atrial flutter: Secondary | ICD-10-CM | POA: Diagnosis present

## 2020-12-01 DIAGNOSIS — G4733 Obstructive sleep apnea (adult) (pediatric): Secondary | ICD-10-CM | POA: Insufficient documentation

## 2020-12-01 DIAGNOSIS — Z95810 Presence of automatic (implantable) cardiac defibrillator: Secondary | ICD-10-CM | POA: Diagnosis not present

## 2020-12-01 DIAGNOSIS — Z7902 Long term (current) use of antithrombotics/antiplatelets: Secondary | ICD-10-CM | POA: Insufficient documentation

## 2020-12-01 DIAGNOSIS — Z79899 Other long term (current) drug therapy: Secondary | ICD-10-CM | POA: Diagnosis not present

## 2020-12-01 HISTORY — PX: CARDIOVERSION: SHX1299

## 2020-12-01 SURGERY — CARDIOVERSION
Anesthesia: General

## 2020-12-01 MED ORDER — PROPOFOL 10 MG/ML IV BOLUS
INTRAVENOUS | Status: DC | PRN
  Administered 2020-12-01: 50 mg via INTRAVENOUS
  Administered 2020-12-01: 20 mg via INTRAVENOUS
  Administered 2020-12-01: 30 mg via INTRAVENOUS

## 2020-12-01 MED ORDER — LIDOCAINE 2% (20 MG/ML) 5 ML SYRINGE
INTRAMUSCULAR | Status: DC | PRN
  Administered 2020-12-01: 40 mg via INTRAVENOUS

## 2020-12-01 MED ORDER — PHENYLEPHRINE HCL-NACL 20-0.9 MG/250ML-% IV SOLN
INTRAVENOUS | Status: DC | PRN
  Administered 2020-12-01: 25 ug/min via INTRAVENOUS

## 2020-12-01 MED ORDER — SODIUM CHLORIDE 0.9 % IV SOLN
INTRAVENOUS | Status: AC | PRN
  Administered 2020-12-01: 500 mL via INTRAMUSCULAR

## 2020-12-01 NOTE — Anesthesia Postprocedure Evaluation (Signed)
Anesthesia Post Note  Patient: Craven Crean  Procedure(s) Performed: CARDIOVERSION     Patient location during evaluation: Endoscopy Anesthesia Type: General Level of consciousness: awake and alert Pain management: pain level controlled Vital Signs Assessment: post-procedure vital signs reviewed and stable Respiratory status: spontaneous breathing, nonlabored ventilation, respiratory function stable and patient connected to nasal cannula oxygen Cardiovascular status: blood pressure returned to baseline and stable Postop Assessment: no apparent nausea or vomiting Anesthetic complications: no   No notable events documented.  Last Vitals:  Vitals:   12/01/20 1016 12/01/20 1028  BP: (!) 100/38 (!) 107/43  Pulse: (!) 35 (!) 36  Resp: (!) 9 (!) 22  Temp:    SpO2: 94% 95%    Last Pain:  Vitals:   12/01/20 1028  TempSrc:   PainSc: 0-No pain                 Denzel Etienne L Demitrius Crass

## 2020-12-01 NOTE — Procedures (Signed)
Electrical Cardioversion Procedure Note Chad Hahn [id number] 1945-07-30  Procedure: Electrical Cardioversion Indications:  Atrial Flutter  Procedure Details Consent: Risks of procedure as well as the alternatives and risks of each were explained to the (patient/caregiver).  Consent for procedure obtained. Time Out: Verified patient identification, verified procedure, site/side was marked, verified correct patient position, special equipment/implants available, medications/allergies/relevent history reviewed, required imaging and test results available.  Performed  Patient placed on cardiac monitor, pulse oximetry, supplemental oxygen as necessary.  Sedation given:  Pt sedated by anesthesia with lidocaine 40 mg and diprovan 100 mg IV. Pacer pads placed anterior and posterior chest.  Cardioverted 1 time(s).  Cardioverted at 120J.  Evaluation Findings: Post procedure EKG shows: AV paced Complications: None Patient did tolerate procedure well.   Chad Hahn 12/01/2020, 9:12 AM

## 2020-12-01 NOTE — H&P (Signed)
Office Visit  11/20/2020 [facility]  Chad Sorrow, NP  Cardiology Typical atrial flutter [facility]) +4 more  Dx    Additional Documentation  Vitals:  BP 103/72 Pulse 70 Ht 5\' 11"  (1.803 m) Wt 104.3 kg SpO2 98% BMI 32.08 kg/m BSA 2.29 m  More Vitals  Flowsheets:  Anthropometrics, NEWS, MEWS Score   Encounter Info:  Billing Info, History, Allergies, Detailed Report    All Notes    Progress Notes by , NP at 11/20/2020 9:30 AM  Author: 11/22/2020, NP Author Type: Nurse Practitioner Filed: 11/20/2020  9:41 PM  Note Status: Signed Cosign: Cosign Not Required Encounter Date: 11/20/2020  Editor: 11/22/2020, NP (Nurse Practitioner)             Expand AllCollapse All      Office Visit    Patient Name: Chad Hahn Date of Encounter: 11/20/2020   PCP:  11/22/2020              [facility]  Cardiologist:  Aviva Kluver, MD ([place]) /  Advanced Practice Provider:  No care team member to display Electrophysiologist:  None      Chief Complaint    Chad Hahn is a 75 y.o. male with a hx of chronic systolic heart failure, Saint Jude CRT-D SD 2013 with generator change 12/2018, CAD s/p CABG with redo and PCI's, DM2, HTN, OSA on CPAP, BPH presents today for follow up of atrial flutter    Past Medical History        Past Medical History:  Diagnosis Date   BPH (benign prostatic hyperplasia)     CAD (coronary artery disease)      CABG age 89 and redo 1996, multiple PCIs as well   Chronic systolic CHF (congestive heart failure) (HCC)     DM2 (diabetes mellitus, type 2) (HCC)     Hypertension     ICD (implantable cardioverter-defibrillator) in place      St jude   Obesity     OSA on CPAP           Past Surgical History:  Procedure Laterality Date   BYPASS GRAFT ANGIOGRAPHY       CHOLECYSTECTOMY       PACEMAKER IMPLANT          Allergies   No Known Allergies   History of Present  Illness    Chad Hahn is a 75 y.o. male with a hx of chronic systolic heart failure, Saint Jude CRT-D SD 2013 with generator change 12/2018, CAD s/p CABG with redo and PCI's, DM2, HTN, OSA on CPAP, BPH  last seen 11/02/2020 by heart and vascular transitions of care clinic.   Prior to recent admission he primarily worked on the 01/03/2021.  His cardiac history initiated at age 22 with CABG, subsequent redo CABG 1996, multiple repeat cardiac catheterizations with 2 stents in 2012.  CRT-D was placed in 2013.  Per his report echocardiogram prior to recent admission was about 1 year ago with EF 30%.   He was admitted June 2022 with worsening dyspnea.  He was in atrial flutter on admission.  On device interrogation he has been in atrial flutter since March 2022.  Echocardiogram 10/22/2020 with EF 20-25%, akinetic/aneurysmal apex, mild LVH, dilated IVC.  He was treated with IV Lasix.  ACE was transitioned eventually to [facility].  Jardiance initiated as well as spironolactone.  He was started on Eliquis with plans for outpatient  cardioversion.   He was seen in follow-up by transitions of care clinic 11/02/2020 noting orthostatic hypotension.  BP was running in the 90s systolic.  His Lasix was transitioned from 60 mg daily to as needed.  Potassium supplement was stopped.   He presents today for follow up with his daughter. Reports no shortness of breath at rest and dyspnea on exertion with more than usual activity. Reports no chest pain, pressure, or tightness. No edema, orthopnea, PND. Reports no palpitations.  He is hopeful his energy level will improve with restoration of normal sinus rhythm. He is appreciative of the cardiac care he is receiving. He does plan to return to [place] once his medical status is improved.   EKGs/Labs/Other Studies Reviewed:    The following studies were reviewed today:   TTE 10/22/20  1. 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 The myocardium in  this region  appears thinned consistent with prior infarction. No apical  thrombus on contrast imaging. All other LV segments are hypokinetic. EF  severely reduced ~20-25%. Left  ventricular ejection fraction, by estimation, is 20 to 25%. The left  ventricle has severely decreased function. The left ventricle demonstrates  regional wall motion abnormalities (see scoring diagram/findings for  description). There is mild concentric  left ventricular hypertrophy. Indeterminate diastolic filling due to E-A  fusion.   2. Right ventricular systolic function is severely reduced. The right  ventricular size is normal. There is normal pulmonary artery systolic  pressure. The estimated right ventricular systolic pressure is 33.8 mmHg.   3. The mitral valve is grossly normal. Trivial mitral valve  regurgitation. No evidence of mitral stenosis.   4. The aortic valve is grossly normal. Aortic valve regurgitation is not  visualized. No aortic stenosis is present.   5. The inferior vena cava is dilated in size with <50% respiratory  variability, suggesting right atrial pressure of 15 mmHg.   Conclusion(s)/Recommendation(s): Findings consistent with ischemic  cardiomyopathy.     EKG:  EKG is  ordered today.  The ekg ordered today demonstrates V-paced 70 bpm with underlying atrial flutter.   Recent Labs: 10/22/2020: ALT 25; Hemoglobin 12.6; Platelets 132 10/25/2020: TSH 1.179 10/26/2020: Magnesium 2.4 11/02/2020: B Natriuretic Peptide 75.8; BUN 32; Creatinine, Ser 1.25; Potassium 4.8; Sodium 133  Recent Lipid Panel Labs (Brief)          Component Value Date/Time    CHOL 111 11/02/2020 1003    TRIG 98 11/02/2020 1003    HDL 28 (L) 11/02/2020 1003    CHOLHDL 4.0 11/02/2020 1003    VLDL 20 11/02/2020 1003    LDLCALC 63 11/02/2020 1003      Home Medications    Active Medications      Current Meds  Medication Sig   apixaban (ELIQUIS) 5 MG TABS tablet Take 1 tablet (5 mg total) by mouth 2 (two) times daily.    carvedilol (COREG) 25 MG tablet Take 25 mg by mouth 2 (two) times daily with a meal.   clopidogrel (PLAVIX) 75 MG tablet Take 75 mg by mouth every morning.   finasteride (PROSCAR) 5 MG tablet Take 5 mg by mouth every evening.   furosemide (LASIX) 20 MG tablet Take 3 tablets (60 mg total) by mouth daily.   metFORMIN (GLUCOPHAGE-XR) 500 MG 24 hr tablet Take 500 mg by mouth 2 (two) times daily.   nitroGLYCERIN (NITROSTAT) 0.4 MG SL tablet Place 0.4 mg under the tongue every 5 (five) minutes as needed for chest pain.  potassium chloride SA (KLOR-CON) 20 MEQ tablet Take 1 tablet (20 mEq total) by mouth daily for 30 doses.   rosuvastatin (CRESTOR) 40 MG tablet Take 40 mg by mouth at bedtime.   sacubitril-valsartan (ENTRESTO) 24-26 MG Take 1 tablet by mouth 2 (two) times daily.   spironolactone (ALDACTONE) 25 MG tablet Take 1 tablet (25 mg total) by mouth daily.        Review of Systems      All other systems reviewed and are otherwise negative except as noted above.   Physical Exam    VS:  BP 103/72   Pulse 70   Ht 5\' 11"  (1.803 m)   Wt 230 lb (104.3 kg)   SpO2 98%   BMI 32.08 kg/m  , BMI Body mass index is 32.08 kg/m.      Wt Readings from Last 3 Encounters:  11/20/20 230 lb (104.3 kg)  11/02/20 232 lb 12.8 oz (105.6 kg)  10/27/20 231 lb (104.8 kg)      GEN: Well nourished, well developed, in no acute distress. HEENT: normal. Neck: Supple, no JVD, carotid bruits, or masses. Cardiac: RRR, no murmurs, rubs, or gallops. No clubbing, cyanosis, edema.  Radials/PT 2+ and equal bilaterally. Respiratory:  Respirations regular and unlabored, clear to auscultation bilaterally. GI: Soft, nontender, nondistended. MS: No deformity or atrophy. Skin: Warm and dry, no rash. Neuro:  Strength and sensation are intact. Psych: Normal affect.   Assessment & Plan    HFrEF- Euvolemic and well compensated on exam. GDMT includes Entresto, Coreg, Lasix, Spironolactone, Jardiance. Jardiance  samples today. Low salt diet and fluid restriction encouraged.    S/p CRT-D- Follows with with EP in 12/28/20. We discussed establishing with EP with our group if he plans to stay in [place] long term. At this point, anticipates staying until cardiac function   CAD s/p CABG with redo CABG and multiple PCI's - Reports no chest pain, pressure, tightness. EKG with no acute ST/T wave changes. If exertional dyspnea and exercise tolerance does not improve with restoration of NSR consider ischemic evaluation. GDMT includes Jardiance, Clopidogrel, Rosuvastatin. No aspirin due to anticoagulation.    Atrial flutter /anticoagulation - Still in atrial flutter by EKG today. He reports dyspnea on exertion and fatigue. He is interested in pursuing cardioversion. He reports no missed doses of Eliquis.    Shared Decision Making/Informed Consent{ The risks (stroke, cardiac arrhythmias rarely resulting in the need for a temporary or permanent pacemaker, skin irritation or burns and complications associated with conscious sedation including aspiration, arrhythmia, respiratory failure and death), benefits (restoration of normal sinus rhythm) and alternatives of a direct current cardioversion were explained in detail to Mr. Guillet and he agrees to proceed.    OSA - CPAP compliance encouraged.    Disposition: Follow up 1 week after DCCV.   Signed, Darolyn Rua, NP 11/20/2020, 10:05 AM [facility]       For DCCV; compliant with apixaban; no changes. 11/22/2020

## 2020-12-01 NOTE — Anesthesia Preprocedure Evaluation (Addendum)
Anesthesia Evaluation  Patient identified by MRN, date of birth, ID band Patient awake    Reviewed: Allergy & Precautions, NPO status , Patient's Chart, lab work & pertinent test results  Airway Mallampati: I  TM Distance: >3 FB Neck ROM: Full    Dental no notable dental hx. (+) Teeth Intact, Dental Advisory Given   Pulmonary sleep apnea ,    Pulmonary exam normal breath sounds clear to auscultation       Cardiovascular hypertension, + CAD, + Cardiac Stents, + CABG and +CHF  Normal cardiovascular exam+ dysrhythmias (on eliquis, no missed doses) Atrial Fibrillation + pacemaker  Rhythm:Irregular Rate:Normal  TTE 2022 1. 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 The myocardium in  this region appears thinned consistent with prior infarction. No apical  thrombus on contrast imaging. All other LV segments are hypokinetic. EF  severely reduced ~20-25%. Left  ventricular ejection fraction, by estimation, is 20 to 25%. The left  ventricle has severely decreased function. The left ventricle demonstrates  regional wall motion abnormalities (see scoring diagram/findings for  description). There is mild concentric  left ventricular hypertrophy. Indeterminate diastolic filling due to E-A  fusion.  2. Right ventricular systolic function is severely reduced. The right  ventricular size is normal. There is normal pulmonary artery systolic  pressure. The estimated right ventricular systolic pressure is 33.8 mmHg.  3. The mitral valve is grossly normal. Trivial mitral valve  regurgitation. No evidence of mitral stenosis.  4. The aortic valve is grossly normal. Aortic valve regurgitation is not  visualized. No aortic stenosis is present.  5. The inferior vena cava is dilated in size with <50% respiratory  variability, suggesting right atrial pressure of 15 mmHg.    Neuro/Psych negative neurological ROS  negative psych ROS    GI/Hepatic negative GI ROS, Neg liver ROS,   Endo/Other  diabetes, Type 2, Oral Hypoglycemic Agents  Renal/GU negative Renal ROS  negative genitourinary   Musculoskeletal negative musculoskeletal ROS (+)   Abdominal   Peds  Hematology negative hematology ROS (+)   Anesthesia Other Findings   Reproductive/Obstetrics                            Anesthesia Physical Anesthesia Plan  ASA: 4  Anesthesia Plan: General   Post-op Pain Management:    Induction: Intravenous  PONV Risk Score and Plan: Propofol infusion and Treatment may vary due to age or medical condition  Airway Management Planned: Natural Airway  Additional Equipment:   Intra-op Plan:   Post-operative Plan:   Informed Consent: I have reviewed the patients History and Physical, chart, labs and discussed the procedure including the risks, benefits and alternatives for the proposed anesthesia with the patient or authorized representative who has indicated his/her understanding and acceptance.     Dental advisory given  Plan Discussed with: CRNA  Anesthesia Plan Comments:         Anesthesia Quick Evaluation

## 2020-12-01 NOTE — Anesthesia Procedure Notes (Signed)
Procedure Name: General with mask airway Date/Time: 12/01/2020 9:54 AM Performed by: Adria Dill, CRNA Pre-anesthesia Checklist: Patient identified, Emergency Drugs available, Suction available and Patient being monitored Patient Re-evaluated:Patient Re-evaluated prior to induction Oxygen Delivery Method: Ambu bag Preoxygenation: Pre-oxygenation with 100% oxygen Induction Type: IV induction Ventilation: Mask ventilation without difficulty Placement Confirmation: positive ETCO2 and breath sounds checked- equal and bilateral Dental Injury: Teeth and Oropharynx as per pre-operative assessment

## 2020-12-01 NOTE — Transfer of Care (Signed)
Immediate Anesthesia Transfer of Care Note  Patient: Chad Hahn  Procedure(s) Performed: CARDIOVERSION  Patient Location: PACU and Endoscopy Unit  Anesthesia Type:General  Level of Consciousness: drowsy and patient cooperative  Airway & Oxygen Therapy: Patient Spontanous Breathing and Patient connected to nasal cannula oxygen  Post-op Assessment: Report given to RN and Post -op Vital signs reviewed and stable  Post vital signs: Reviewed and stable  Last Vitals:  Vitals Value Taken Time  BP    Temp    Pulse    Resp    SpO2      Last Pain:  Vitals:   12/01/20 0913  TempSrc: Oral  PainSc: 0-No pain         Complications: No notable events documented.

## 2020-12-01 NOTE — Discharge Instructions (Signed)

## 2020-12-01 NOTE — Interval H&P Note (Signed)
History and Physical Interval Note:  12/01/2020 9:13 AM  Chad Hahn  has presented today for surgery, with the diagnosis of AFLUTTER.  The various methods of treatment have been discussed with the patient and family. After consideration of risks, benefits and other options for treatment, the patient has consented to  Procedure(s): CARDIOVERSION (N/A) as a surgical intervention.  The patient's history has been reviewed, patient examined, no change in status, stable for surgery.  I have reviewed the patient's chart and labs.  Questions were answered to the patient's satisfaction.     Olga Millers

## 2020-12-02 ENCOUNTER — Encounter (HOSPITAL_COMMUNITY): Payer: Self-pay | Admitting: Cardiology

## 2020-12-07 NOTE — Progress Notes (Signed)
Cardiology Office Note:    Date:  12/07/2020   ID:  Chad Hahn, DOB 04-19-1946, MRN [id number]  PCP:  Aviva Kluver   [facility] Providers Cardiologist:  [name], MD      Referring MD: No ref. provider found   Follow-up for coronary artery disease and atrial flutter status post DCCV.  History of Present Illness:    Chad Hahn is a 75 y.o. male with a hx of chronic systolic CHF, [employer] Jude CRT-D 2013, generator change out 9/20, coronary artery disease status post CABG with redo and PCI's, diabetes mellitus type 2, hypertension, obstructive sleep apnea on CPAP, BPH, and atrial flutter.  He is previously from the [facility].  He underwent CABG at age 54 and had a redo CABG in 1996.  He has had multiple repeat cardiac catheterizations with 2 stents in 2012.  His ICD was placed in 2013.  Patient reported EF is around 30%.  He was admitted to the hospital 6/22 with worsening dyspnea.  He was found to be in atrial flutter.  On device interrogation he had been in atrial flutter since 3/22.  His echocardiogram 10/22/2020 showed an LVEF of 25-30%, akinesis and aneurysmal apex, mild LVH, dilated IVC.  He received IV diuresis.  His ACE inhibitor was transitioned to [place].  Jardiance was initiated as well as spironolactone.  He was started on apixaban at that time and plans for DCCV were discussed.  He was seen by transition of care 7/22 and was noted to have orthostatic hypotension.  His blood pressure was running in the 90s systolic.  His furosemide was transitioned from 60 mg to as needed dosing.  His potassium supplementation was discontinued at that time.  He was seen by Gillian Shields, NP-C on 11/20/2020.  He presented with his daughter.  He denied shortness of breath at rest and did note increased dyspnea with exertion.  He denied chest pain pressure and tightness.  He had no lower extremity edema, denied orthopnea and PND.  He denied palpitations.  He was hopeful that after DCCV and  restoration of NSR his energy levels were improved.  He was appreciative of his cardiac care.  He did plan to return to [place] once his medical status had improved.  Underwent successful DCCV 12/01/2020 with 120 J x 1.  Is post EKG findings showed an AV paced rhythm.  He tolerated procedure well.  He presents the clinic today for follow-up evaluation states he feels well.  He reports that he has not noticed any irregular heartbeats or palpitations.  His main complaint is that he does not have the energy/endurance that he once did.  He is feeling much better since his cardioversion and is working on walking about 15 minutes/day.  His EKG today shows atrial sensed ventricular paced rhythm.  His weight has been stable.  He reports that he has been continue to monitor his weight and tries to eat a heart healthy diet.  He reports that he does not know when he can return to [place] and is contemplating moving to this area to be closer to family.  He has been impressed with his cardiac care to this point.  I will give him the salty 6 diet sheet, have him continue to increase his physical activity as tolerated, continue his current medication regimen, and have him follow-up with Dr. Antoine Poche in 3 months.  Today he denies chest pain, shortness of breath, lower extremity edema, fatigue, palpitations, melena, hematuria, hemoptysis, diaphoresis, weakness, presyncope,  syncope, orthopnea, and PND.   Past Medical History:  Diagnosis Date   BPH (benign prostatic hyperplasia)    CAD (coronary artery disease)    CABG age 29 and redo 1996, multiple PCIs as well   Chronic systolic CHF (congestive heart failure) (HCC)    DM2 (diabetes mellitus, type 2) (HCC)    Hypertension    ICD (implantable cardioverter-defibrillator) in place    St jude   Obesity    OSA on CPAP     Past Surgical History:  Procedure Laterality Date   BYPASS GRAFT ANGIOGRAPHY     CARDIOVERSION N/A 12/01/2020   Procedure: CARDIOVERSION;   Surgeon: Lewayne Bunting, MD;  Location: [facility];  Service: Cardiovascular;  Laterality: N/A;   CHOLECYSTECTOMY     PACEMAKER IMPLANT      Current Medications: No outpatient medications have been marked as taking for the 12/08/20 encounter (Appointment) with Ronney Asters, NP.     Allergies:   Patient has no known allergies.   Social History   Socioeconomic History   Marital status: Married    Spouse name: Not on file   Number of children: Not on file   Years of education: Not on file   Highest education level: Not on file  Occupational History   Not on file  Tobacco Use   Smoking status: Never   Smokeless tobacco: Never  Vaping Use   Vaping Use: Never used  Substance and Sexual Activity   Alcohol use: Not Currently   Drug use: Never   Sexual activity: Not on file  Other Topics Concern   Not on file  Social History Narrative   Not on file   Social Determinants of Health   Financial Resource Strain: Not on file  Food Insecurity: Not on file  Transportation Needs: Not on file  Physical Activity: Not on file  Stress: Not on file  Social Connections: Not on file     Family History: The patient's family history includes Heart disease in his mother and paternal uncle.  ROS:   Please see the history of present illness.     All other systems reviewed and are negative.   Risk Assessment/Calculations:           Physical Exam:    VS:  There were no vitals taken for this visit.    Wt Readings from Last 3 Encounters:  12/01/20 229 lb 15 oz (104.3 kg)  11/20/20 230 lb (104.3 kg)  11/02/20 232 lb 12.8 oz (105.6 kg)     GEN:  Well nourished, well developed in no acute distress HEENT: Normal NECK: No JVD; No carotid bruits LYMPHATICS: No lymphadenopathy CARDIAC: Atrial sensed ventricular paced, RRR, no murmurs, rubs, gallops RESPIRATORY:  Clear to auscultation without rales, wheezing or rhonchi  ABDOMEN: Soft, non-tender,  non-distended MUSCULOSKELETAL:  No edema; No deformity  SKIN: Warm and dry NEUROLOGIC:  Alert and oriented x 3 PSYCHIATRIC:  Normal affect    EKGs/Labs/Other Studies Reviewed:    The following studies were reviewed today: Echocardiogram 10/22/2020  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 The myocardium in  this region appears thinned consistent with prior infarction. No apical  thrombus on contrast imaging. All other LV segments are hypokinetic. EF  severely reduced ~20-25%. Left  ventricular ejection fraction, by estimation, is 20 to 25%. The left  ventricle has severely decreased function. The left ventricle demonstrates  regional wall motion abnormalities (see scoring diagram/findings for  description). There is mild concentric  left ventricular hypertrophy. Indeterminate diastolic filling due to E-A  fusion.   2. Right ventricular systolic function is severely reduced. The right  ventricular size is normal. There is normal pulmonary artery systolic  pressure. The estimated right ventricular systolic pressure is 33.8 mmHg.   3. The mitral valve is grossly normal. Trivial mitral valve  regurgitation. No evidence of mitral stenosis.   4. The aortic valve is grossly normal. Aortic valve regurgitation is not  visualized. No aortic stenosis is present.   5. The inferior vena cava is dilated in size with <50% respiratory  variability, suggesting right atrial pressure of 15 mmHg.   Conclusion(s)/Recommendation(s): Findings consistent with ischemic  cardiomyopathy.  EKG:  EKG is  ordered today.  The ekg ordered today demonstrates atrial sensed ventricular paced rhythm 68 bpm.  EKG 11/20/2020 V-paced rhythm 70 bpm with underlying atrial flutter  Recent Labs: 10/22/2020: ALT 25 10/25/2020: TSH 1.179 10/26/2020: Magnesium 2.4 11/02/2020: B Natriuretic Peptide 75.8 11/24/2020: BUN 28; Creatinine, Ser 1.10; Hemoglobin 15.2; Platelets 183; Potassium 4.7; Sodium 135  Recent Lipid  Panel    Component Value Date/Time   CHOL 111 11/02/2020 1003   TRIG 98 11/02/2020 1003   HDL 28 (L) 11/02/2020 1003   CHOLHDL 4.0 11/02/2020 1003   VLDL 20 11/02/2020 1003   LDLCALC 63 11/02/2020 1003    ASSESSMENT & PLAN    Atrial flutter-EKG today shows atrial sensed ventricular paced rhythm 68 bpm.  Denies any recent accelerated heartbeats and reports increased energy/endurance.  Underwent DCCV on 12/01/2020.  Post EKG showed AV paced rhythm. Continue apixaban, carvedilol Heart healthy low-sodium diet-salty 6 given Increase physical activity as tolerated Avoid triggers caffeine, chocolate, EtOH, dehydration etc.  HFrEF-no increased DOE or activity intolerance.  Euvolemic today.  Weight today 230.6 pounds.  Recent echocardiogram showed EF 20-25% Continue Entresto, carvedilol, furosemide, spironolactone, Jardiance Heart healthy low-sodium diet-salty 6 given Increase physical activity as tolerated Daily weights  Coronary artery disease-denies any recent episodes of arm neck back or chest discomfort.  Has had redo CABG and multiple PCI's. Continue Plavix, rosuvastatin, Jardiance, carvedilol Heart healthy low-sodium diet-salty 6 given Increase physical activity as tolerated  CRT-D-EKG today shows atrial sensed ventricular paced rhythm 68 bpm.  Reports he he is undecided about when he will return to [place] for follow-up with his EP  Disposition: Follow-up with Dr. Antoine Poche in 3 months.       Medication Adjustments/Labs and Tests Ordered: Current medicines are reviewed at length with the patient today.  Concerns regarding medicines are outlined above.  No orders of the defined types were placed in this encounter.  No orders of the defined types were placed in this encounter.   There are no Patient Instructions on file for this visit.   Signed, Ronney Asters, NP  12/07/2020 6:31 AM      Notice: This dictation was prepared with Dragon dictation along with smaller  phrase technology. Any transcriptional errors that result from this process are unintentional and may not be corrected upon review.  I spent14 minutes examining this patient, reviewing medications, and using patient centered shared decision making involving her cardiac care.  Prior to her visit I spent greater than 20 minutes reviewing her past medical history,  medications, and prior cardiac tests.

## 2020-12-08 ENCOUNTER — Ambulatory Visit (INDEPENDENT_AMBULATORY_CARE_PROVIDER_SITE_OTHER): Payer: Medicare Other | Admitting: General Practice

## 2020-12-08 ENCOUNTER — Other Ambulatory Visit: Payer: Self-pay

## 2020-12-08 ENCOUNTER — Encounter (HOSPITAL_BASED_OUTPATIENT_CLINIC_OR_DEPARTMENT_OTHER): Payer: Self-pay | Admitting: General Practice

## 2020-12-08 VITALS — BP 90/50 | HR 68 | Ht 71.0 in | Wt 230.6 lb

## 2020-12-08 DIAGNOSIS — I483 Typical atrial flutter: Secondary | ICD-10-CM | POA: Diagnosis not present

## 2020-12-08 DIAGNOSIS — Z95810 Presence of automatic (implantable) cardiac defibrillator: Secondary | ICD-10-CM | POA: Diagnosis not present

## 2020-12-08 DIAGNOSIS — I25118 Atherosclerotic heart disease of native coronary artery with other forms of angina pectoris: Secondary | ICD-10-CM

## 2020-12-08 DIAGNOSIS — I5022 Chronic systolic (congestive) heart failure: Secondary | ICD-10-CM | POA: Diagnosis not present

## 2020-12-08 NOTE — Patient Instructions (Signed)
Medication Instructions:  Your physician recommends that you continue on your current medications as directed. Please refer to the Current Medication list given to you today.  *If you need a refill on your cardiac medications before your next appointment, please call your pharmacy*   Follow-Up: At [facility], you and your health needs are our priority.  As part of our continuing mission to provide you with exceptional heart care, we have created designated Provider Care Teams.  These Care Teams include your primary Cardiologist (physician) and Advanced Practice Providers (APPs -  Physician Assistants and Nurse Practitioners) who all work together to provide you with the care you need, when you need it.  We recommend signing up for the patient portal called "MyChart".  Sign up information is provided on this After Visit Summary.  MyChart is used to connect with patients for Virtual Visits (Telemedicine).  Patients are able to view lab/test results, encounter notes, upcoming appointments, etc.  Non-urgent messages can be sent to your provider as well.   To learn more about what you can do with MyChart, go to [email or web address].    Your next appointment:   3 month(s)  The format for your next appointment:   In Person  Provider:   You may see [name], MD or one of the following Advanced Practice Providers on your designated Care Team:   Theodore Demark, PA-C Juanda Crumble, PA-C Joni Reining, DNP, ANP   Other Instructions  Edd Fabian, FNP has recommended to increase your physical activity as tolerated. Please review the following information about a low sodium diet and Atrial Fibrillation Triggers to avoid.     Atrial Fibrillation  Follow these instructions at home: Medicines Take over-the-counter and prescription medicines only as told by your doctor. Do not take any new medicines without first talking to your doctor. If you are taking blood thinners: Talk  with your doctor before you take any medicines that have aspirin or NSAIDs, such as ibuprofen, in them. Take your medicine exactly as told by your doctor. Take it at the same time each day. Avoid activities that could hurt or bruise you. Follow instructions about how to prevent falls. Wear a bracelet that says you are taking blood thinners. Or, carry a card that lists what medicines you take. Lifestyle     Do not use any products that have nicotine or tobacco in them. These include cigarettes, e-cigarettes, and chewing tobacco. If you need help quitting, ask your doctor. Eat heart-healthy foods. Talk with your doctor about the right eating plan for you. Exercise regularly as told by your doctor. Do not drink alcohol or caffeine. Lose weight if you are overweight. Do not use drugs, including cannabis.  General instructions If you have a condition that causes breathing to stop for a short period of time (apnea), treat it as told by your doctor. Keep a healthy weight. Do not use diet pills unless your doctor says they are safe for you. Diet pills may make heart problems worse. Keep all follow-up visits as told by your doctor. This is important.  Contact a doctor if: You notice a change in the speed, rhythm, or strength of your heartbeat. You are taking a blood-thinning medicine and you get more bruising. You get tired more easily when you move or exercise. You have a sudden change in weight.  Get help right away if:  You have pain in your chest or your belly (abdomen). You have trouble breathing. You have side effects of blood  thinners, such as blood in your vomit, poop (stool), or pee (urine), or bleeding that cannot stop. You have any signs of a stroke. "BE FAST" is an easy way to remember the main warning signs: B - Balance. Signs are dizziness, sudden trouble walking, or loss of balance. E - Eyes. Signs are trouble seeing or a change in how you see. F - Face. Signs are sudden  weakness or loss of feeling in the face, or the face or eyelid drooping on one side. A - Arms. Signs are weakness or loss of feeling in an arm. This happens suddenly and usually on one side of the body. S - Speech. Signs are sudden trouble speaking, slurred speech, or trouble understanding what people say. T - Time. Time to call emergency services. Write down what time symptoms started. You have other signs of a stroke, such as: A sudden, very bad headache with no known cause. Feeling like you may vomit (nausea). Vomiting. A seizure.  These symptoms may be an emergency. Do not wait to see if the symptoms will go away. Get medical help right away. Call your local emergency services (911 in the [place] Do not drive yourself to the hospital. Summary  This information is not intended to replace advice given to you by your health care provider. Make sure you discuss any questions you have with your healthcare provider. Document Revised: 10/03/2018 Document Reviewed: 10/03/2018 Elsevier Patient Education  2022 [employer].

## 2020-12-21 ENCOUNTER — Other Ambulatory Visit (HOSPITAL_COMMUNITY): Payer: Self-pay

## 2021-03-02 ENCOUNTER — Telehealth: Payer: Self-pay | Admitting: Cardiology

## 2021-03-02 NOTE — Telephone Encounter (Signed)
New message:     Patient is scheduled for an appointment on 03-12-21. He is out of town and will be out of town on 03-12-21. He wants to know if he can do a Virtual Visit on 03-12-21, since he will be out of town please?

## 2021-03-04 NOTE — Telephone Encounter (Signed)
Appointment rescheduled for 11/11 vias scheduler.   [name], MD  You 1 hour ago (3:32 PM)   I would be happy to do his appt tomorrow.  I am rounding in the hospital and could do it about any time if he is able and somebody can register him.  If so we can open his appt next up for somebody else.  Thanks.

## 2021-03-04 NOTE — Telephone Encounter (Signed)
Pt informed MD ok with changing appointment to video. Pt verbalized understanding and appointment updated.   [name], MD  You Yesterday (7:37 AM)   Yes.  Video only

## 2021-03-05 ENCOUNTER — Telehealth: Payer: Self-pay

## 2021-03-05 ENCOUNTER — Telehealth (INDEPENDENT_AMBULATORY_CARE_PROVIDER_SITE_OTHER): Payer: Medicare Other | Admitting: Cardiology

## 2021-03-05 ENCOUNTER — Other Ambulatory Visit: Payer: Self-pay

## 2021-03-05 ENCOUNTER — Encounter: Payer: Self-pay | Admitting: Cardiology

## 2021-03-05 VITALS — BP 118/60 | HR 60 | Ht 70.5 in | Wt 240.0 lb

## 2021-03-05 DIAGNOSIS — E785 Hyperlipidemia, unspecified: Secondary | ICD-10-CM | POA: Diagnosis not present

## 2021-03-05 DIAGNOSIS — I5022 Chronic systolic (congestive) heart failure: Secondary | ICD-10-CM

## 2021-03-05 DIAGNOSIS — E11319 Type 2 diabetes mellitus with unspecified diabetic retinopathy without macular edema: Secondary | ICD-10-CM

## 2021-03-05 DIAGNOSIS — I5042 Chronic combined systolic (congestive) and diastolic (congestive) heart failure: Secondary | ICD-10-CM | POA: Diagnosis not present

## 2021-03-05 DIAGNOSIS — I2510 Atherosclerotic heart disease of native coronary artery without angina pectoris: Secondary | ICD-10-CM

## 2021-03-05 DIAGNOSIS — I255 Ischemic cardiomyopathy: Secondary | ICD-10-CM

## 2021-03-05 MED ORDER — ENTRESTO 49-51 MG PO TABS: 1.0000 | ORAL_TABLET | Freq: Two times a day (BID) | ORAL | 3 refills | Status: DC

## 2021-03-05 MED ORDER — FUROSEMIDE 20 MG PO TABS: 20.0000 mg | ORAL_TABLET | Freq: Every day | ORAL | 0 refills | Status: DC

## 2021-03-05 NOTE — Telephone Encounter (Signed)
Opened in error

## 2021-03-05 NOTE — Addendum Note (Signed)
Addended by: Daleen Bo I on: 03/05/2021 12:26 PM   Modules accepted: Orders

## 2021-03-05 NOTE — Patient Instructions (Signed)
Medication Instructions:  Your physician has recommended you make the following change in your medication:   INCREASE: entresto to 49/51 mg twice a day  Per our conversation today, you will call the office when you need refills.  *If you need a refill on your cardiac medications before your next appointment, please call your pharmacy*   Lab Work: Dr. Antoine Poche would like for you to have labs checked in 1 month: BMET, LIPID, CBC, and A1C (we will mail you these orders)  If you have labs (blood work) drawn today and your tests are completely normal, you will receive your results only by: MyChart Message (if you have MyChart) OR A paper copy in the mail If you have any lab test that is abnormal or we need to change your treatment, we will call you to review the results.   Testing/Procedures: None  Follow-Up:  :1}  With Dr. Antoine Poche via MyChart Video Visit on 06/03/21 at 10:20 AM.  We will send a message to our device clinic team to get you set up with them. They will be reaching out to you in the near future.

## 2021-03-05 NOTE — Progress Notes (Signed)
Virtual Visit via Video Note   This visit type was conducted due to national recommendations for restrictions regarding the COVID-19 Pandemic (e.g. social distancing) in an effort to limit this patient's exposure and mitigate transmission in our community.  Due to his co-morbid illnesses, this patient is at least at moderate risk for complications without adequate follow up.  This format is felt to be most appropriate for this patient at this time.  All issues noted in this document were discussed and addressed.  A limited physical exam was performed with this format.  Please refer to the patient's chart for his consent to telehealth for [facility].   Date:  03/05/2021   ID:  Chad Hahn, DOB 12-Jun-1945, MRN [id number] The patient was identified using 2 identifiers.  Patient Location: Home Provider Location: Office/Clinic   PCP:  Pcp, No   [facility] Providers Cardiologist:  Minus Breeding, MD     Evaluation Performed:  Follow-Up Visit  Chief Complaint:  CHF  History of Present Illness:    Chad Hahn is a 75 y.o. male with a hx of chronic systolic CHF, [employer] CRT-D 2013, generator change out 9/20, coronary artery disease status post CABG with redo and PCI's, diabetes mellitus type 2, hypertension, obstructive sleep apnea on CPAP, BPH, and atrial flutter.   He is previously from the [facility].  He underwent CABG at age 36 and had a redo CABG in 1996.  He has had multiple repeat cardiac catheterizations with 2 stents in 2012.  His ICD was placed in 2013.  Patient reported EF is around 30%.  He was admitted to the hospital 6/22 with worsening dyspnea.  He was found to be in atrial flutter.  On device interrogation he had been in atrial flutter since 3/22.  His echocardiogram 10/22/2020 showed an LVEF of 25-30%, akinesis and aneurysmal apex, mild LVH, dilated IVC.  He received IV diuresis.  His ACE inhibitor was transitioned to [place].  Jardiance was initiated as well as  spironolactone.  He was started on apixaban at that time and plans for DCCV were discussed.   He was seen by transition of care 7/22 and was noted to have orthostatic hypotension.  His blood pressure was running in the 0000000 systolic.  His furosemide was transitioned from 60 mg to as needed dosing.  His potassium supplementation was discontinued at that time. He was seen by Laurann Montana, NP-C on 11/20/2020.  He underwent successful DCCV 12/01/2020 with 120 J x 1.  Is post EKG findings showed an AV paced rhythm.  He tolerated procedure well.  He presents for virtual visit at his request.  He actually is doing quite well.  He has not gotten back to [place] yet but is planning on just moving his stuff back.  And living here permanently at his daughter's request.  He is exercising routinely.  He says he is not having any new shortness of breath, PND or orthopnea.  He wears CPAP at night.  He is not having any leg swelling.  He is not having any palpitations and does not think he has been out of rhythm.  He has had some twinges of discomfort but it is exacerbated by moving his arm and he thinks it is arthritis.  He has not had to take nitroglycerin.  Its not at all like his previous angina.   Past Medical History:  Diagnosis Date   BPH (benign prostatic hyperplasia)    CAD (coronary artery disease)  CABG age 30 and redo 1996, multiple PCIs as well   Chronic systolic CHF (congestive heart failure) (HCC)    DM2 (diabetes mellitus, type 2) ([place])    Hypertension    ICD (implantable cardioverter-defibrillator) in place    St jude   Obesity    OSA on CPAP    Past Surgical History:  Procedure Laterality Date   BYPASS GRAFT ANGIOGRAPHY     CARDIOVERSION N/A 12/01/2020   Procedure: CARDIOVERSION;  Surgeon: Lelon Perla, MD;  Location: [facility];  Service: Cardiovascular;  Laterality: N/A;   CHOLECYSTECTOMY     PACEMAKER IMPLANT      Prior to Admission medications   Medication Sig Start Date  End Date Taking? Authorizing Provider  apixaban (ELIQUIS) 5 MG TABS tablet Take 1 tablet (5 mg total) by mouth 2 (two) times daily. 11/20/20 05/19/21 Yes Loel Dubonnet, NP  carvedilol (COREG) 25 MG tablet Take 0.5 tablets (12.5 mg total) by mouth 2 (two) times daily with a meal. 11/20/20  Yes Loel Dubonnet, NP  clopidogrel (PLAVIX) 75 MG tablet Take 75 mg by mouth every morning.   Yes [provider]  empagliflozin (JARDIANCE) 10 MG TABS tablet Take 1 tablet (10 mg total) by mouth daily before breakfast. 11/20/20  Yes Loel Dubonnet, NP  finasteride (PROSCAR) 5 MG tablet Take 5 mg by mouth every evening.   Yes [provider]  furosemide (LASIX) 20 MG tablet Take 3 tablets (60 mg total) by mouth daily. Patient taking differently: Take 20 mg by mouth daily. 10/28/20 12/01/22 Yes Arrien, Jimmy Picket, MD  metFORMIN (GLUCOPHAGE-XR) 500 MG 24 hr tablet Take 500 mg by mouth 2 (two) times daily.   Yes [provider]  nitroGLYCERIN (NITROSTAT) 0.4 MG SL tablet Place 0.4 mg under the tongue every 5 (five) minutes x 3 doses as needed for chest pain.   Yes [provider]  rosuvastatin (CRESTOR) 40 MG tablet Take 40 mg by mouth at bedtime.   Yes [provider]  sacubitril-valsartan (ENTRESTO) 24-26 MG Take 1 tablet by mouth 2 (two) times daily. 11/24/20 05/23/21 Yes Loel Dubonnet, NP  spironolactone (ALDACTONE) 25 MG tablet Take 1 tablet (25 mg total) by mouth daily. 11/24/20 05/23/21 Yes Loel Dubonnet, NP  potassium chloride SA (KLOR-CON) 20 MEQ tablet Take 1 tablet (20 mEq total) by mouth daily for 30 doses. Patient not taking: Reported on 03/05/2021 10/27/20 12/08/20  Arrien, Jimmy Picket, MD     Current Meds  Medication Sig   apixaban (ELIQUIS) 5 MG TABS tablet Take 1 tablet (5 mg total) by mouth 2 (two) times daily.   carvedilol (COREG) 25 MG tablet Take 0.5 tablets (12.5 mg total) by mouth 2 (two) times daily with a meal.   clopidogrel (PLAVIX)  75 MG tablet Take 75 mg by mouth every morning.   empagliflozin (JARDIANCE) 10 MG TABS tablet Take 1 tablet (10 mg total) by mouth daily before breakfast.   finasteride (PROSCAR) 5 MG tablet Take 5 mg by mouth every evening.   furosemide (LASIX) 20 MG tablet Take 3 tablets (60 mg total) by mouth daily. (Patient taking differently: Take 20 mg by mouth daily.)   metFORMIN (GLUCOPHAGE-XR) 500 MG 24 hr tablet Take 500 mg by mouth 2 (two) times daily.   nitroGLYCERIN (NITROSTAT) 0.4 MG SL tablet Place 0.4 mg under the tongue every 5 (five) minutes x 3 doses as needed for chest pain.   rosuvastatin (CRESTOR) 40 MG tablet Take 40  mg by mouth at bedtime.   sacubitril-valsartan (ENTRESTO) 24-26 MG Take 1 tablet by mouth 2 (two) times daily.   spironolactone (ALDACTONE) 25 MG tablet Take 1 tablet (25 mg total) by mouth daily.     Allergies:   Patient has no known allergies.   Social History   Tobacco Use   Smoking status: Never   Smokeless tobacco: Never  Vaping Use   Vaping Use: Never used  Substance Use Topics   Alcohol use: Not Currently   Drug use: Never     Family Hx: The patient's family history includes Heart disease in his mother and paternal uncle.  ROS:   Please see the history of present illness.    None All other systems reviewed and are negative.   Prior CV studies:   The following studies were reviewed today:  Previous hospital visits  Labs/Other Tests and Data Reviewed:    EKG:  No ECG reviewed.  Recent Labs: 10/22/2020: ALT 25 10/25/2020: TSH 1.179 10/26/2020: Magnesium 2.4 11/02/2020: B Natriuretic Peptide 75.8 11/24/2020: BUN 28; Creatinine, Ser 1.10; Hemoglobin 15.2; Platelets 183; Potassium 4.7; Sodium 135   Recent Lipid Panel Lab Results  Component Value Date/Time   CHOL 111 11/02/2020 10:03 AM   TRIG 98 11/02/2020 10:03 AM   HDL 28 (L) 11/02/2020 10:03 AM   CHOLHDL 4.0 11/02/2020 10:03 AM   LDLCALC 63 11/02/2020 10:03 AM    Wt Readings from Last 3  Encounters:  03/05/21 240 lb (108.9 kg)  12/08/20 230 lb 9.6 oz (104.6 kg)  12/01/20 229 lb 15 oz (104.3 kg)     Risk Assessment/Calculations:    CHA2DS2-VASc Score =   6   Objective:    Vital Signs:  BP 118/60   Pulse 60   Ht 5' 10.5" (1.791 m)   Wt 240 lb (108.9 kg)   BMI 33.95 kg/m    VITAL SIGNS:  reviewed GEN:  no acute distress EYES:  sclerae anicteric, EOMI - Extraocular Movements Intact NEURO:  alert and oriented x 3, no obvious focal deficit PSYCH:  normal affect  ASSESSMENT & PLAN:     Atrial flutter-he has had no recurrent arrhythmias.  No change in therapy.  He tolerates anticoagulation.  I will check a CBC.   HFrEF-   Today I am going to increase his Entresto to 49/53 twice daily.  He will get a basic metabolic profile in about 1 month.  He will remain on the other meds as listed.   Coronary artery disease -   the patient has had no chest discomfort.  He will continue with aggressive risk reduction.   CRT-D    Now that he has determined that he will live permanently in [place] and we are going to enroll him in our device clinic.  DM: I will check an A1c when he comes back for labs.  DYSLIPIDEMIA: I will check a fasting lipid profile.         Time:   Today, I have spent 20 minutes with the patient with telehealth technology discussing the above problems.     Medication Adjustments/Labs and Tests Ordered: Current medicines are reviewed at length with the patient today.  Concerns regarding medicines are outlined above.   Tests Ordered: No orders of the defined types were placed in this encounter.   Medication Changes: No orders of the defined types were placed in this encounter.   Follow Up:   Can be virtual or in person  in 3 month(s)  Signed, Minus Breeding, MD  03/05/2021 11:42 AM    [facility]

## 2021-03-05 NOTE — Telephone Encounter (Signed)
  Patient Consent for Virtual Visit        Chad Hahn has provided verbal consent on 03/05/2021 for a virtual visit (video or telephone).   CONSENT FOR VIRTUAL VISIT FOR:  Chad Hahn  By participating in this virtual visit I agree to the following:  I hereby voluntarily request, consent and authorize [facility] and its employed or contracted physicians, physician assistants, nurse practitioners or other licensed health care professionals (the Practitioner), to provide me with telemedicine health care services (the "Services") as deemed necessary by the treating Practitioner. I acknowledge and consent to receive the Services by the Practitioner via telemedicine. I understand that the telemedicine visit will involve communicating with the Practitioner through live audiovisual communication technology and the disclosure of certain medical information by electronic transmission. I acknowledge that I have been given the opportunity to request an in-person assessment or other available alternative prior to the telemedicine visit and am voluntarily participating in the telemedicine visit.  I understand that I have the right to withhold or withdraw my consent to the use of telemedicine in the course of my care at any time, without affecting my right to future care or treatment, and that the Practitioner or I may terminate the telemedicine visit at any time. I understand that I have the right to inspect all information obtained and/or recorded in the course of the telemedicine visit and may receive copies of available information for a reasonable fee.  I understand that some of the potential risks of receiving the Services via telemedicine include:  Delay or interruption in medical evaluation due to technological equipment failure or disruption; Information transmitted may not be sufficient (e.g. poor resolution of images) to allow for appropriate medical decision making by the Practitioner; and/or   In rare instances, security protocols could fail, causing a breach of personal health information.  Furthermore, I acknowledge that it is my responsibility to provide information about my medical history, conditions and care that is complete and accurate to the best of my ability. I acknowledge that Practitioner's advice, recommendations, and/or decision may be based on factors not within their control, such as incomplete or inaccurate data provided by me or distortions of diagnostic images or specimens that may result from electronic transmissions. I understand that the practice of medicine is not an exact science and that Practitioner makes no warranties or guarantees regarding treatment outcomes. I acknowledge that a copy of this consent can be made available to me via my patient portal [facility] MyChart), or I can request a printed copy by calling the office of [facility].    I understand that my insurance will be billed for this visit.   I have read or had this consent read to me. I understand the contents of this consent, which adequately explains the benefits and risks of the Services being provided via telemedicine.  I have been provided ample opportunity to ask questions regarding this consent and the Services and have had my questions answered to my satisfaction. I give my informed consent for the services to be provided through the use of telemedicine in my medical care

## 2021-03-10 ENCOUNTER — Other Ambulatory Visit: Payer: Self-pay | Admitting: *Deleted

## 2021-03-10 DIAGNOSIS — Z95810 Presence of automatic (implantable) cardiac defibrillator: Secondary | ICD-10-CM

## 2021-03-12 ENCOUNTER — Telehealth: Payer: Medicare Other | Admitting: Cardiology

## 2021-04-01 ENCOUNTER — Other Ambulatory Visit (HOSPITAL_BASED_OUTPATIENT_CLINIC_OR_DEPARTMENT_OTHER): Payer: Self-pay | Admitting: Family

## 2021-04-01 DIAGNOSIS — Z7901 Long term (current) use of anticoagulants: Secondary | ICD-10-CM

## 2021-04-01 DIAGNOSIS — I483 Typical atrial flutter: Secondary | ICD-10-CM

## 2021-04-01 DIAGNOSIS — I5022 Chronic systolic (congestive) heart failure: Secondary | ICD-10-CM

## 2021-04-05 ENCOUNTER — Other Ambulatory Visit: Payer: Self-pay | Admitting: *Deleted

## 2021-04-05 DIAGNOSIS — E785 Hyperlipidemia, unspecified: Secondary | ICD-10-CM

## 2021-04-05 LAB — LIPID PANEL
Chol/HDL Ratio: 4.4 ratio (ref 0.0–5.0)
Cholesterol, Total: 157 mg/dL (ref 100–199)
HDL: 36 mg/dL — ABNORMAL LOW (ref >39)
LDL Chol Calc (NIH): 90 mg/dL (ref 0–99)
Triglycerides: 181 mg/dL — ABNORMAL HIGH (ref 0–149)
VLDL Cholesterol Cal: 31 mg/dL (ref 5–40)

## 2021-04-05 LAB — BASIC METABOLIC PANEL
BUN/Creatinine Ratio: 19 (ref 10–24)
BUN: 23 mg/dL (ref 8–27)
CO2: 25 mmol/L (ref 20–29)
Calcium: 9.7 mg/dL (ref 8.6–10.2)
Chloride: 99 mmol/L (ref 96–106)
Creatinine, Ser: 1.18 mg/dL (ref 0.76–1.27)
Glucose: 131 mg/dL — ABNORMAL HIGH (ref 70–99)
Potassium: 4.5 mmol/L (ref 3.5–5.2)
Sodium: 137 mmol/L (ref 134–144)
eGFR: 64 mL/min/{1.73_m2} (ref >59)

## 2021-04-05 LAB — CBC
Hematocrit: 45.3 % (ref 37.5–51.0)
Hemoglobin: 15.4 g/dL (ref 13.0–17.7)
MCH: 31 pg (ref 26.6–33.0)
MCHC: 34 g/dL (ref 31.5–35.7)
MCV: 91 fL (ref 79–97)
Platelets: 150 10*3/uL (ref 150–450)
RBC: 4.96 x10E6/uL (ref 4.14–5.80)
RDW: 12.9 % (ref 11.6–15.4)
WBC: 5.2 10*3/uL (ref 3.4–10.8)

## 2021-04-05 LAB — HEMOGLOBIN A1C
Est. average glucose Bld gHb Est-mCnc: 157 mg/dL
Hgb A1c MFr Bld: 7.1 % — ABNORMAL HIGH (ref 4.8–5.6)

## 2021-04-05 MED ORDER — EZETIMIBE 10 MG PO TABS: 10.0000 mg | ORAL_TABLET | Freq: Every day | ORAL | 3 refills | Status: DC

## 2021-04-07 ENCOUNTER — Telehealth: Payer: Self-pay | Admitting: Cardiology

## 2021-04-07 MED ORDER — ENTRESTO 49-51 MG PO TABS
1.0000 | ORAL_TABLET | Freq: Two times a day (BID) | ORAL | 3 refills | Status: DC
Start: 2021-04-07 — End: 2021-04-08

## 2021-04-07 NOTE — Telephone Encounter (Signed)
Patient called because he wants to make sure he is suppose to take rosuvastatin (CRESTOR) 40 MG tablet and ezetimibe (ZETIA) 10 MG tablet. Or is he to stop taking the rosuvastatin.

## 2021-04-07 NOTE — Telephone Encounter (Signed)
°*  STAT* If patient is at the pharmacy, call can be transferred to refill team.   1. Which medications need to be refilled? (please list name of each medication and dose if known) sacubitril-valsartan (ENTRESTO) 49-51 MG   2. Which pharmacy/location (including street and city if local pharmacy) is medication to be sent to?  [facility] - [place], [place] - [address]  3. Do they need a 30 day or 90 day supply? 90 day

## 2021-04-07 NOTE — Telephone Encounter (Signed)
Called pt and informed MD would like him to add zetia in addition to rosuvastatin. Pt verbalized understanding.   [name], MD  04/03/2021  9:33 PM EST     LDL is not at target.  Please add Zetia 10 mg PO daily.  Repeat BMET in 3 months.  Call Mr. Fleming with the results

## 2021-04-08 ENCOUNTER — Telehealth: Payer: Self-pay | Admitting: Cardiology

## 2021-04-08 MED ORDER — ENTRESTO 49-51 MG PO TABS: 1.0000 | ORAL_TABLET | Freq: Two times a day (BID) | ORAL | 3 refills | Status: DC

## 2021-04-08 NOTE — Telephone Encounter (Signed)
Medication sent to pts pharmacy. Spoke with pt and pt is aware that RX was sent to his pharmacy.

## 2021-04-08 NOTE — Telephone Encounter (Signed)
° ° °*  STAT* If patient is at the pharmacy, call can be transferred to refill team.   1. Which medications need to be refilled? (please list name of each medication and dose if known)   sacubitril-valsartan (ENTRESTO) 49-51 MG    2. Which pharmacy/location (including street and city if local pharmacy) is medication to be sent to? [facility] - [place], [place] - [address] fax# [phone]  3. Do they need a 30 day or 90 day supply? 90 days (180 tablets)

## 2021-04-09 MED ORDER — ENTRESTO 49-51 MG PO TABS: 1.0000 | ORAL_TABLET | Freq: Two times a day (BID) | ORAL | 3 refills | Status: DC

## 2021-04-09 NOTE — Telephone Encounter (Signed)
Patient is following up. He states [facility] still has not received Rx and he will be out of medication soon. Is someone able to contact the pharmacy? Please assist.  *STAT* If patient is at the pharmacy, call can be transferred to refill team.   1. Which medications need to be refilled? (please list name of each medication and dose if known)  sacubitril-valsartan (ENTRESTO) 24-26 MG  2. Which pharmacy/location (including street and city if local pharmacy) is medication to be sent to? [facility] - [place], [place] - [address]  3. Do they need a 30 day or 90 day supply?   90 day supply

## 2021-04-09 NOTE — Telephone Encounter (Signed)
Medication resent to [facility].

## 2021-04-09 NOTE — Addendum Note (Signed)
Addended by: Kerney Elbe on: 04/09/2021 11:58 AM   Modules accepted: Orders

## 2021-04-12 MED ORDER — ENTRESTO 49-51 MG PO TABS: 1.0000 | ORAL_TABLET | Freq: Two times a day (BID) | ORAL | 3 refills | Status: DC

## 2021-04-12 NOTE — Addendum Note (Signed)
Addended by: Raelyn Number on: 04/12/2021 01:29 PM   Modules accepted: Orders

## 2021-04-12 NOTE — Telephone Encounter (Signed)
Patient is following up, stating the pharmacy has not received his Rx and he will be out of medication in 1 week. Please contact pharmacy to confirm received/correct fax information.  *STAT* If patient is at the pharmacy, call can be transferred to refill team.   1. Which medications need to be refilled? (please list name of each medication and dose if known)  sacubitril-valsartan (ENTRESTO) 49-51 MG  2. Which pharmacy/location (including street and city if local pharmacy) is medication to be sent to? [facility] - [place], [place] - [address] FAX: [phone]  3. Do they need a 30 day or 90 day supply?   90 day supply

## 2021-04-12 NOTE — Telephone Encounter (Signed)
Refills has been sent to the pharmacy. 

## 2021-04-13 ENCOUNTER — Other Ambulatory Visit: Payer: Self-pay

## 2021-04-13 MED ORDER — ENTRESTO 49-51 MG PO TABS: 1.0000 | ORAL_TABLET | Freq: Two times a day (BID) | ORAL | 3 refills | Status: DC

## 2021-04-13 NOTE — Telephone Encounter (Signed)
Routed to refill team 

## 2021-04-13 NOTE — Telephone Encounter (Signed)
Patient is following up. [facility] notified him that they received the Rx, but then it was cancelled. Can someone call the patient to let him know what is going on. He worries he may run out of medication. Looks like the order has been discontinued.   *STAT* If patient is at the pharmacy, call can be transferred to refill team.   1. Which medications need to be refilled? (please list name of each medication and dose if known)  sacubitril-valsartan (ENTRESTO) 49-51 MG  2. Which pharmacy/location (including street and city if local pharmacy) is medication to be sent to? [facility] - [place], [place] - [address] FAX: [phone]  3. Do they need a 30 day or 90 day supply?  90 day supply

## 2021-04-13 NOTE — Telephone Encounter (Signed)
Spoke to patient and informed him the refill for Sacubitril-Valsartan Sherryll Burger) 49-51 MG was sent to pharmacy 04/12/2021 but the transmission between our office and the pharmacy failed. So I resent the refill to [facility] ([facility]

## 2021-04-14 NOTE — Progress Notes (Signed)
Electrophysiology TeleHealth Note   Due to national recommendations of social distancing due to COVID 19, an audio/video telehealth visit is felt to be most appropriate for this patient at this time.  See Epic message for the patient's consent to telehealth for [facility].   Date:  04/15/2021   ID:  Chad Hahn, DOB May 01, 1945, MRN [id number]  Location: patient's home  Provider location: [address] [place]  Evaluation Performed: Follow-up visit  PCP:  Pcp, No  Cardiologist:  Minus Breeding, MD  Electrophysiologist:  Dr Curt Bears  Chief Complaint:  ICD  History of Present Illness:    Chad Hahn is a 75 y.o. male who presents via audio/video conferencing for a telehealth visit today.  Since last being seen in our clinic, the patient reports doing very well.  Today, he denies symptoms of palpitations, chest pain, shortness of breath,  lower extremity edema, dizziness, presyncope, or syncope.  The patient is otherwise without complaint today.  The patient denies symptoms of fevers, chills, cough, or new SOB worrisome for COVID 19.  He is being referred for evaluation of his ICD by [facility].  He has a history of chronic systolic heart failure, Saint Jude CRT-D implanted in 2013 with generator change in 2020, coronary artery disease status post CABG and multiple PCI's, type 2 diabetes, hypertension, OSA on CPAP, BPH, atrial flutter.  He had his CABG at age 57 with redo CABG in 1996.  He has had multiple catheterizations with stents in 2012.  He was diagnosed with atrial flutter March 2022.  He had an echo June 2022 with an ejection fraction of 25 to 30%.  He had a cardioversion for atrial flutter 12/01/2020.  Today, denies symptoms of palpitations, chest pain, shortness of breath, orthopnea, PND, lower extremity edema, claudication, dizziness, presyncope, syncope, bleeding, or neurologic sequela. The patient is tolerating medications without difficulties.  He is  currently feeling well.  He is able to exercise without issue.  Despite having poor cardiac function, he is able to do all of his daily activities.  He currently works for [employer] and travels around the country to disaster areas.  Past Medical History:  Diagnosis Date   BPH (benign prostatic hyperplasia)    CAD (coronary artery disease)    CABG age 2 and redo 1996, multiple PCIs as well   Chronic systolic CHF (congestive heart failure) (HCC)    DM2 (diabetes mellitus, type 2) ([place])    Hypertension    ICD (implantable cardioverter-defibrillator) in place    St jude   Obesity    OSA on CPAP     Past Surgical History:  Procedure Laterality Date   BYPASS GRAFT ANGIOGRAPHY     CARDIOVERSION N/A 12/01/2020   Procedure: CARDIOVERSION;  Surgeon: Lelon Perla, MD;  [facility]: [facility];  Service: Cardiovascular;  Laterality: N/A;   CHOLECYSTECTOMY     PACEMAKER IMPLANT      Current Outpatient Medications  Medication Sig Dispense Refill   ezetimibe (ZETIA) 10 MG tablet Take 1 tablet (10 mg total) by mouth daily. 90 tablet 3   carvedilol (COREG) 25 MG tablet Take 0.5 tablets (12.5 mg total) by mouth 2 (two) times daily with a meal.     clopidogrel (PLAVIX) 75 MG tablet Take 75 mg by mouth every morning.     ELIQUIS 5 MG TABS tablet TAKE 1 TABLET TWICE DAILY 180 tablet 1   finasteride (PROSCAR) 5 MG tablet Take 5 mg by mouth every evening.  furosemide (LASIX) 20 MG tablet Take 1 tablet (20 mg total) by mouth daily. 30 tablet 0   JARDIANCE 10 MG TABS tablet TAKE 1 TABLET EVERY DAY BEFORE BREAKFAST 90 tablet 3   metFORMIN (GLUCOPHAGE-XR) 500 MG 24 hr tablet Take 500 mg by mouth 2 (two) times daily.     nitroGLYCERIN (NITROSTAT) 0.4 MG SL tablet Place 0.4 mg under the tongue every 5 (five) minutes x 3 doses as needed for chest pain.     rosuvastatin (CRESTOR) 40 MG tablet Take 40 mg by mouth at bedtime.     sacubitril-valsartan (ENTRESTO) 49-51 MG Take 1 tablet by mouth 2 (two) times  daily. 180 tablet 3   spironolactone (ALDACTONE) 25 MG tablet TAKE 1 TABLET EVERY DAY 90 tablet 3   No current facility-administered medications for this visit.    Allergies:   Patient has no known allergies.   Social History:  The patient  reports that he has never smoked. He has never used smokeless tobacco. He reports that he does not currently use alcohol. He reports that he does not use drugs.   Family History:  The patient's  family history includes Heart disease in his mother and paternal uncle.   ROS:  Please see the history of present illness.   All other systems are personally reviewed and negative.    Exam:    Vital Signs:  BP 132/60    Pulse 60   Well appearing, alert and conversant, regular work of breathing,  good skin color Eyes- anicteric, neuro- grossly intact, skin- no apparent rash or lesions or cyanosis, mouth- oral mucosa is pink   Labs/Other Tests and Data Reviewed:    Recent Labs: 10/22/2020: ALT 25 10/25/2020: TSH 1.179 10/26/2020: Magnesium 2.4 11/02/2020: B Natriuretic Peptide 75.8 04/03/2021: BUN 23; Creatinine, Ser 1.18; Hemoglobin 15.4; Platelets 150; Potassium 4.5; Sodium 137   Wt Readings from Last 3 Encounters:  03/05/21 240 lb (108.9 kg)  12/08/20 230 lb 9.6 oz (104.6 kg)  12/01/20 229 lb 15 oz (104.3 kg)     Other studies personally reviewed: Additional studies/ records that were reviewed today include: TTE  10/22/20 Review of the above records today demonstrates:    1. 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 The myocardium in  this region appears thinned consistent with prior infarction. No apical  thrombus on contrast imaging. All other LV segments are hypokinetic. EF  severely reduced ~20-25%. Left  ventricular ejection fraction, by estimation, is 20 to 25%. The left  ventricle has severely decreased function. The left ventricle demonstrates  regional wall motion abnormalities (see scoring diagram/findings for  description).  There is mild concentric  left ventricular hypertrophy. Indeterminate diastolic filling due to E-A  fusion.   2. Right ventricular systolic function is severely reduced. The right  ventricular size is normal. There is normal pulmonary artery systolic  pressure. The estimated right ventricular systolic pressure is [id number] mmHg.   3. The mitral valve is grossly normal. Trivial mitral valve  regurgitation. No evidence of mitral stenosis.   4. The aortic valve is grossly normal. Aortic valve regurgitation is not  visualized. No aortic stenosis is present.   5. The inferior vena cava is dilated in size with <50% respiratory  variability, suggesting right atrial pressure of 15 mmHg.   TTE 11/1620-personally reviewed Atrial sensed, ventricular paced   ASSESSMENT & PLAN:    1.  Atrial flutter: Found on device interrogation.  Currently on Eliquis 5 mg twice daily, carvedilol 12.5  mg twice daily.  CHA2DS2-VASc of at least 5.  Chad Hahn address atrial flutter once remote monitoring has been arranged.  2.  Chronic systolic heart failure due to ischemic cardiomyopathy: Currently on Entresto 49/51 mg twice daily, Aldactone 25 mg daily, carvedilol 12.5 mg twice daily.  Status post Saint Jude CRT-D implanted in 2013 with generator change in 2020.  Device functioning appropriately.  We Chad Hahn continue with current management.  We Chad Hahn arrange for follow-up in device clinic.   COVID 19 screen The patient denies symptoms of COVID 19 at this time.  The importance of social distancing was discussed today.  Follow-up: 1 year  Current medicines are reviewed at length with the patient today.   The patient does not have concerns regarding his medicines.  The following changes were made today: None  Labs/ tests ordered today include:  No orders of the defined types were placed in this encounter.    Patient Risk:  after full review of this patients clinical status, I feel that they are at moderate risk at this  time.     Signed, Chad Hahn Chad Loa, MD  04/15/2021 8:06 AM     [facility] [address] [place] [address] [phone] (office) [phone] (fax)

## 2021-04-15 ENCOUNTER — Encounter: Payer: Self-pay | Admitting: Cardiology

## 2021-04-15 ENCOUNTER — Telehealth (INDEPENDENT_AMBULATORY_CARE_PROVIDER_SITE_OTHER): Payer: Medicare Other | Admitting: Cardiology

## 2021-04-15 ENCOUNTER — Other Ambulatory Visit: Payer: Self-pay

## 2021-04-15 VITALS — BP 132/60 | HR 60

## 2021-04-15 DIAGNOSIS — I255 Ischemic cardiomyopathy: Secondary | ICD-10-CM | POA: Diagnosis not present

## 2021-04-15 NOTE — Patient Instructions (Signed)
Medication Instructions:  Your physician recommends that you continue on your current medications as directed. Please refer to the Current Medication list given to you today.  *If you need a refill on your cardiac medications before your next appointment, please call your pharmacy*   Lab Work: None ordered   Testing/Procedures: None ordered   Follow-Up: At [facility], you and your health needs are our priority.  As part of our continuing mission to provide you with exceptional heart care, we have created designated Provider Care Teams.  These Care Teams include your primary Cardiologist (physician) and Advanced Practice Providers (APPs -  Physician Assistants and Nurse Practitioners) who all work together to provide you with the care you need, when you need it.  Your next appointment:   1 year(s)  The format for your next appointment:   In Person  Provider:   Loman Brooklyn, MD    Thank you for choosing [facility]!!   Dory Horn, RN [phone]   Other Instructions   The device clinic will be in touch with you to get you established.

## 2021-06-02 DIAGNOSIS — I4892 Unspecified atrial flutter: Secondary | ICD-10-CM | POA: Insufficient documentation

## 2021-06-02 NOTE — Progress Notes (Signed)
Virtual Visit via Video Note   This visit type was conducted due to national recommendations for restrictions regarding the COVID-19 Pandemic (e.g. social distancing) in an effort to limit this patient's exposure and mitigate transmission in our community.  Due to his co-morbid illnesses, this patient is at least at moderate risk for complications without adequate follow up.  This format is felt to be most appropriate for this patient at this time.  All issues noted in this document were discussed and addressed.  A limited physical exam was performed with this format.  Please refer to the patient's chart for his consent to telehealth for [facility].       Date:  06/03/2021   ID:  Chad Hahn, DOB 09-12-1945, MRN [id number] The patient was identified using 2 identifiers.  Patient Location: Home Provider Location: Office/Clinic   PCP:  Pcp, No   [facility] Providers Cardiologist:  Minus Breeding, MD Electrophysiologist:  Will Meredith Leeds, MD {   Evaluation Performed:  Follow-Up Visit  Chief Complaint:  Cardiomyopathy  History of Present Illness:    Chad Hahn is a 76 y.o. male  who presents for follow up of chronic systolic CHF, Saint Jude CRT-D 2013, generator change out, coronary artery disease status post CABG with redo and PCI's, diabetes mellitus type 2, hypertension, obstructive sleep apnea on CPAP, BPH, and atrial flutter.   He is previously from the [facility].  He underwent CABG at age 54 and had a redo CABG in 1996.  He has had multiple repeat cardiac catheterizations with 2 stents in 2012.  His ICD was placed in 2013.  Patient reported EF is around 30%.  He was admitted to the hospital 09/2020 with worsening dyspnea.  He was found to be in atrial flutter.  On device interrogation he had been in atrial flutter since 3/22.  His echocardiogram 10/22/2020 showed an LVEF of 25-30%, akinesis and aneurysmal apex, mild LVH, dilated IVC.  He received IV diuresis.  His ACE  inhibitor was transitioned to [place].  Jardiance was initiated as well as spironolactone.  He was started on apixaban at that time and plans for DCCV were discussed.  He was seen by transition of care  and was noted to have orthostatic hypotension.  His blood pressure was running in the 0000000 systolic.  His furosemide was transitioned from 60 mg to as needed dosing.  His potassium supplementation was discontinued at that time. He was seen by Laurann [place], NP-C on 11/20/2020.  He underwent successful DCCV 12/01/2020 with 120 J x 1.  Is post EKG findings showed an AV paced rhythm.  He tolerated procedure well.   He presents for virtual visit at his request.  He is actually working in [place] right now.  He goes out every day and he walks.  He denies any new cardiovascular symptoms.  He has had no new shortness of breath, PND or orthopnea.  He said no palpitations, presyncope or syncope.  He denies any chest pain.  He tolerated the increase dose of Entresto that I prescribed last time.  He did have an electrical shock while working around his house but he does not think he had any issues with his device following this.  Does not look like he has had the remote monitoring because he does not yet have the wiring at his house to allow this.   Past Medical History:  Diagnosis Date   BPH (benign prostatic hyperplasia)    CAD (coronary artery disease)  CABG age 29 and redo 1996, multiple PCIs as well   Chronic systolic CHF (congestive heart failure) (HCC)    DM2 (diabetes mellitus, type 2) ([place])    Hypertension    ICD (implantable cardioverter-defibrillator) in place    St jude   Obesity    OSA on CPAP    Past Surgical History:  Procedure Laterality Date   BYPASS GRAFT ANGIOGRAPHY     CARDIOVERSION N/A 12/01/2020   Procedure: CARDIOVERSION;  Surgeon: Lelon Perla, MD;  Location: [facility];  Service: Cardiovascular;  Laterality: N/A;   CHOLECYSTECTOMY     PACEMAKER IMPLANT      Prior to  Admission medications   Medication Sig Start Date End Date Taking? Authorizing Provider  carvedilol (COREG) 25 MG tablet Take 0.5 tablets (12.5 mg total) by mouth 2 (two) times daily with a meal. 11/20/20  Yes Loel Dubonnet, NP  clopidogrel (PLAVIX) 75 MG tablet Take 75 mg by mouth every morning.   Yes [provider]  ELIQUIS 5 MG TABS tablet TAKE 1 TABLET TWICE DAILY 04/01/21  Yes Minus Breeding, MD  ezetimibe (ZETIA) 10 MG tablet Take 1 tablet (10 mg total) by mouth daily. 04/05/21 07/04/21 Yes Minus Breeding, MD  finasteride (PROSCAR) 5 MG tablet Take 5 mg by mouth every evening.   Yes [provider]  furosemide (LASIX) 20 MG tablet Take 1 tablet (20 mg total) by mouth daily. 03/05/21 06/03/21 Yes Lavone Weisel, Jeneen Rinks, MD  JARDIANCE 10 MG TABS tablet TAKE 1 TABLET EVERY DAY BEFORE BREAKFAST 04/01/21  Yes Loel Dubonnet, NP  metFORMIN (GLUCOPHAGE-XR) 500 MG 24 hr tablet Take 500 mg by mouth 2 (two) times daily.   Yes [provider]  nitroGLYCERIN (NITROSTAT) 0.4 MG SL tablet Place 0.4 mg under the tongue every 5 (five) minutes x 3 doses as needed for chest pain.   Yes [provider]  rosuvastatin (CRESTOR) 40 MG tablet Take 40 mg by mouth at bedtime.   Yes [provider]  sacubitril-valsartan (ENTRESTO) 49-51 MG Take 1 tablet by mouth 2 (two) times daily. 04/13/21  Yes Minus Breeding, MD  spironolactone (ALDACTONE) 25 MG tablet TAKE 1 TABLET EVERY DAY 04/01/21  Yes Loel Dubonnet, NP       Allergies:   Patient has no known allergies.   Social History   Tobacco Use   Smoking status: Never   Smokeless tobacco: Never  Vaping Use   Vaping Use: Never used  Substance Use Topics   Alcohol use: Not Currently   Drug use: Never     Family Hx: The patient's family history includes Heart disease in his mother and paternal uncle.  ROS:   Please see the history of present illness.     All other systems reviewed and are negative.   Prior  CV studies:   The following studies were reviewed today:  None  Labs/Other Tests and Data Reviewed:    EKG:  No ECG reviewed.  Recent Labs: 10/22/2020: ALT 25 10/25/2020: TSH 1.179 10/26/2020: Magnesium 2.4 11/02/2020: B Natriuretic Peptide 75.8 04/03/2021: BUN 23; Creatinine, Ser 1.18; Hemoglobin 15.4; Platelets 150; Potassium 4.5; Sodium 137   Recent Lipid Panel Lab Results  Component Value Date/Time   CHOL 157 04/03/2021 11:26 AM   TRIG 181 (H) 04/03/2021 11:26 AM   HDL 36 (L) 04/03/2021 11:26 AM   CHOLHDL 4.4 04/03/2021 11:26 AM   CHOLHDL 4.0 11/02/2020 10:03 AM   LDLCALC 90 04/03/2021 11:26 AM    Wt Readings  from Last 3 Encounters:  06/03/21 230 lb (104.3 kg)  03/05/21 240 lb (108.9 kg)  12/08/20 230 lb 9.6 oz (104.6 kg)     Risk Assessment/Calculations:          Objective:    Vital Signs:  BP 118/69    Pulse 60    Ht 5\' 11"  (1.803 m)    Wt 230 lb (104.3 kg)    BMI 32.08 kg/m    VITAL SIGNS:  reviewed GEN:  no acute distress EYES:  sclerae anicteric, EOMI - Extraocular Movements Intact NEURO:  alert and oriented x 3, no obvious focal deficit PSYCH:  normal affect  ASSESSMENT & PLAN:    Atrial flutter: He has had no further arrhythmias.  No change in therapy.  HFrEF: Through the conversation today he has had no symptoms that would suggest volume overload.  There is no evidence on video evaluation.  No change in therapy.  I do not think he would tolerate up titration as he is having some dizziness and mild orthostasis with the current meds.  I will leave these where they are.  Coronary artery disease : He is not having any angina.  No further work-up.  He will continue with risk reduction.  CRT-D : We will check with our device clinic to see if there are any options for remote monitoring in his situation.  DM:   A1c was 7.1.  No change in therapy.    DYSLIPIDEMIA: LDL was actually 90 which is higher than previous.  He is going to come back in 3 months and if  he is not at target I would switch him to [place].  He previously had been well controlled.     Time:   Today, I have spent 16 minutes with the patient with telehealth technology discussing the above problems.     Medication Adjustments/Labs and Tests Ordered: Current medicines are reviewed at length with the patient today.  Concerns regarding medicines are outlined above.   Tests Ordered: Orders Placed This Encounter  Procedures   Lipid panel   CBC    Medication Changes: No orders of the defined types were placed in this encounter.   Follow Up:  In Person in 3 month(s)  Signed, Minus Breeding, MD  06/03/2021 11:24 AM    [place]

## 2021-06-03 ENCOUNTER — Telehealth: Payer: Self-pay

## 2021-06-03 ENCOUNTER — Other Ambulatory Visit: Payer: Self-pay

## 2021-06-03 ENCOUNTER — Encounter: Payer: Self-pay | Admitting: Cardiology

## 2021-06-03 ENCOUNTER — Telehealth (INDEPENDENT_AMBULATORY_CARE_PROVIDER_SITE_OTHER): Payer: Medicare Other | Admitting: Cardiology

## 2021-06-03 VITALS — BP 118/69 | HR 60 | Ht 71.0 in | Wt 230.0 lb

## 2021-06-03 DIAGNOSIS — I5022 Chronic systolic (congestive) heart failure: Secondary | ICD-10-CM

## 2021-06-03 DIAGNOSIS — I2510 Atherosclerotic heart disease of native coronary artery without angina pectoris: Secondary | ICD-10-CM | POA: Diagnosis not present

## 2021-06-03 DIAGNOSIS — I4892 Unspecified atrial flutter: Secondary | ICD-10-CM | POA: Diagnosis not present

## 2021-06-03 DIAGNOSIS — E785 Hyperlipidemia, unspecified: Secondary | ICD-10-CM

## 2021-06-03 NOTE — Telephone Encounter (Signed)
Patient added to [facility]  Transmitter ordered to be sent to address in [place].    Spoke with patient and advised that once he receives monitor to send a transmission to we can set the quarterly transmissions.  I am sending him a Clinical cytogeneticist message with instructions, to which he will respond once he gets the monitor.,    I am also including the phone number for [employer] to call if there are concerns on the whereabouts of the monitor.

## 2021-06-03 NOTE — Patient Instructions (Signed)
°  Lab Work:  Your physician recommends that you return for lab work in: 3 MONTHS-FASTING  If you have labs (blood work) drawn today and your tests are completely normal, you will receive your results only by: [employer] (if you have MyChart) OR A paper copy in the mail If you have any lab test that is abnormal or we need to change your treatment, we will call you to review the results.   Follow-Up: At [facility], you and your health needs are our priority.  As part of our continuing mission to provide you with exceptional heart care, we have created designated Provider Care Teams.  These Care Teams include your primary Cardiologist (physician) and Advanced Practice Providers (APPs -  Physician Assistants and Nurse Practitioners) who all work together to provide you with the care you need, when you need it.  We recommend signing up for the patient portal called "MyChart".  Sign up information is provided on this After Visit Summary.  MyChart is used to connect with patients for Virtual Visits (Telemedicine).  Patients are able to view lab/test results, encounter notes, upcoming appointments, etc.  Non-urgent messages can be sent to your provider as well.   To learn more about what you can do with MyChart, go to [email or web address].    Your next appointment:   7 month(s)  The format for your next appointment:   In Person  Provider:   [name], MD

## 2021-06-03 NOTE — Telephone Encounter (Signed)
-----   Message from Freddi Starr, RN sent at 06/03/2021 11:10 AM EST ----- Dr Antoine Poche saw the patient today and [place]. He was told that someone was working on a way for him to remote checks on his device but he has not heard from anyone. Dr Antoine Poche wants to make sure this is still being worked on. Thank you.

## 2021-09-21 ENCOUNTER — Encounter: Payer: Self-pay | Admitting: *Deleted

## 2021-12-01 LAB — CBC
Hematocrit: 41.7 % (ref 37.5–51.0)
Hemoglobin: 13.8 g/dL (ref 13.0–17.7)
MCH: 29.6 pg (ref 26.6–33.0)
MCHC: 33.1 g/dL (ref 31.5–35.7)
MCV: 89 fL (ref 79–97)
Platelets: 155 10*3/uL (ref 150–450)
RBC: 4.67 x10E6/uL (ref 4.14–5.80)
RDW: 14.1 % (ref 11.6–15.4)
WBC: 4.5 10*3/uL (ref 3.4–10.8)

## 2021-12-01 LAB — LIPID PANEL
Chol/HDL Ratio: 2.8 ratio (ref 0.0–5.0)
Cholesterol, Total: 102 mg/dL (ref 100–199)
HDL: 36 mg/dL — ABNORMAL LOW (ref >39)
LDL Chol Calc (NIH): 44 mg/dL (ref 0–99)
Triglycerides: 119 mg/dL (ref 0–149)
VLDL Cholesterol Cal: 22 mg/dL (ref 5–40)

## 2021-12-30 NOTE — Progress Notes (Signed)
Virtual Visit via Video Note   This visit type was conducted due to national recommendations for restrictions regarding the COVID-19 Pandemic (e.g. social distancing) in an effort to limit this patient's exposure and mitigate transmission in our community.  Due to his co-morbid illnesses, this patient is at least at moderate risk for complications without adequate follow up.  This format is felt to be most appropriate for this patient at this time.  All issues noted in this document were discussed and addressed.  A limited physical exam was performed with this format.  Please refer to the patient's chart for his consent to telehealth for [facility].    Date:  12/31/2021   ID:  Chad Hahn, DOB [birth year], MRN [id number] The patient was identified using 2 identifiers.  Patient Location: Home Provider Location: Office/Clinic   PCP:  Pcp, No   [facility] Providers Cardiologist:  [name], MD Electrophysiologist:  Will Jorja Loa, MD {   Evaluation Performed:  Follow-Up Visit   Chief Complaint:  Cardiomyopathy   History of Present Illness:    Chad Hahn is a 76 y.o. male  who presents for follow up of chronic systolic CHF, [employer] CRT-D 2013, generator change out, coronary artery disease status post CABG with redo and PCI's, diabetes mellitus type 2, hypertension, obstructive sleep apnea on CPAP, BPH, and atrial flutter.   He is previously from the [facility].  He underwent CABG at age 85 and had a redo CABG in 1996.  He has had multiple repeat cardiac catheterizations with 2 stents in 2012.  His ICD was placed in 2013.  Patient reported EF is around 30%.  He was admitted to the hospital 09/2020 with worsening dyspnea.  He was found to be in atrial flutter.  On device interrogation he had been in atrial flutter since 3/22.  His echocardiogram 10/22/2020 showed an LVEF of 25-30%, akinesis and aneurysmal apex, mild LVH, dilated IVC.  He received IV diuresis.  His  ACE inhibitor was transitioned to [place].  Jardiance was initiated as well as spironolactone.  He was started on apixaban at that time and plans for DCCV were discussed.  He was seen by transition of care  and was noted to have orthostatic hypotension.  His blood pressure was running in the 90s systolic.  His furosemide was transitioned from 60 mg to as needed dosing.  His potassium supplementation was discontinued at that time. He was seen by Gillian Shields, NP-C on 11/20/2020.  He underwent successful DCCV 12/01/2020 with 120 J x 1.  Is post EKG findings showed an AV paced rhythm.  He tolerated procedure well.   He presents for virtual visit at his request.  Since I last saw him he is only traveled back to [place] couple of times.  He does have a primary residence here.  He is walking for exercise.  He has occasional lightheadedness and some low blood pressures but has not had any presyncope or syncope.  He denies any chest pressure, neck or arm discomfort.  He has had no new PND or orthopnea.  He has no new shortness of breath.  He has had no weight gain or edema.  He unfortunately has not had his device interrogation because he does not have the wiring in his house to accomplish this remotely.   Past Medical History:  Diagnosis Date   BPH (benign prostatic hyperplasia)    CAD (coronary artery disease)    CABG age 60 and  redo 1996, multiple PCIs as well   Chronic systolic CHF (congestive heart failure) (HCC)    DM2 (diabetes mellitus, type 2) ([place])    Hypertension    ICD (implantable cardioverter-defibrillator) in place    St jude   Obesity    OSA on CPAP    Past Surgical History:  Procedure Laterality Date   BYPASS GRAFT ANGIOGRAPHY     CARDIOVERSION N/A 12/01/2020   Procedure: CARDIOVERSION;  Surgeon: Lelon Perla, MD;  Location: [place];  Service: Cardiovascular;  Laterality: N/A;   CHOLECYSTECTOMY     PACEMAKER IMPLANT      Prior to Admission medications   Medication Sig  Start Date End Date Taking? Authorizing Provider  carvedilol (COREG) 25 MG tablet Take 0.5 tablets (12.5 mg total) by mouth 2 (two) times daily with a meal. 11/20/20  Yes Loel Dubonnet, NP  clopidogrel (PLAVIX) 75 MG tablet Take 75 mg by mouth every morning.   Yes [provider]  ELIQUIS 5 MG TABS tablet TAKE 1 TABLET TWICE DAILY 04/01/21  Yes Minus Breeding, MD  ezetimibe (ZETIA) 10 MG tablet Take 1 tablet (10 mg total) by mouth daily. 04/05/21 07/04/21 Yes Minus Breeding, MD  finasteride (PROSCAR) 5 MG tablet Take 5 mg by mouth every evening.   Yes [provider]  furosemide (LASIX) 20 MG tablet Take 1 tablet (20 mg total) by mouth daily. 03/05/21 06/03/21 Yes Lucile Hillmann, Jeneen Rinks, MD  JARDIANCE 10 MG TABS tablet TAKE 1 TABLET EVERY DAY BEFORE BREAKFAST 04/01/21  Yes Loel Dubonnet, NP  metFORMIN (GLUCOPHAGE-XR) 500 MG 24 hr tablet Take 500 mg by mouth 2 (two) times daily.   Yes [provider]  nitroGLYCERIN (NITROSTAT) 0.4 MG SL tablet Place 0.4 mg under the tongue every 5 (five) minutes x 3 doses as needed for chest pain.   Yes [provider]  rosuvastatin (CRESTOR) 40 MG tablet Take 40 mg by mouth at bedtime.   Yes [provider]  sacubitril-valsartan (ENTRESTO) 49-51 MG Take 1 tablet by mouth 2 (two) times daily. 04/13/21  Yes Minus Breeding, MD  spironolactone (ALDACTONE) 25 MG tablet TAKE 1 TABLET EVERY DAY 04/01/21  Yes Loel Dubonnet, NP       Allergies:   Patient has no known allergies.   Social History   Tobacco Use   Smoking status: Never   Smokeless tobacco: Never  Vaping Use   Vaping Use: Never used  Substance Use Topics   Alcohol use: Not Currently   Drug use: Never     Family Hx: The patient's family history includes Heart disease in his mother and paternal uncle.  ROS:   Please see the history of present illness.    None All other systems reviewed and are negative.   Prior CV studies:   The following studies  were reviewed today:  None  Labs/Other Tests and Data Reviewed:    EKG:  No ECG reviewed.  Recent Labs: 04/03/2021: BUN 23; Creatinine, Ser 1.18; Potassium 4.5; Sodium 137 11/30/2021: Hemoglobin 13.8; Platelets 155   Recent Lipid Panel Lab Results  Component Value Date/Time   CHOL 102 11/30/2021 11:43 AM   TRIG 119 11/30/2021 11:43 AM   HDL 36 (L) 11/30/2021 11:43 AM   CHOLHDL 2.8 11/30/2021 11:43 AM   CHOLHDL 4.0 11/02/2020 10:03 AM   LDLCALC 44 11/30/2021 11:43 AM    Wt Readings from Last 3 Encounters:  12/31/21 240 lb (108.9 kg)  06/03/21 230 lb (104.3 kg)  03/05/21  240 lb (108.9 kg)     Risk Assessment/Calculations:          Objective:    Vital Signs:  BP 115/66   Pulse 74   Ht 5\' 11"  (1.803 m)   Wt 240 lb (108.9 kg)   BMI 33.47 kg/m    GEN: Well-appearing in no distress EYES: Pupils equal round react light NEURO: Nonfocal PSYCH: Oriented to person place and time.  ASSESSMENT & PLAN:      Atrial flutter:   He had no symptomatic recurrence of this.  When he comes back to [place] I am going to work to get him into the device clinic.  He needs to give [place] forewarning that he is coming.  He told his blood thinner and I will check a CBC when he returns.  HFrEF:   He is on optimal medical therapy and by history it seems to be euvolemic.  I did check a basic metabolic profile when he was here last month and this was unremarkable.  No change in therapy.   Coronary artery disease : He is not having any anginal symptoms.  No change in therapy.  CRT-D :   Again we are getting get device interrogation when he is here.   DM:   A1c was 7.1 when checked most recently but he is overdue for follow-up and this will be added to labs to include CBC, BMET.  This will happen when he comes back to town.   DYSLIPIDEMIA: LDL was at target.  No change in therapy.      Time:   Today, I have spent 20 minutes with the patient with telehealth technology discussing the above  problems.     Medication Adjustments/Labs and Tests Ordered: Current medicines are reviewed at length with the patient today.  Concerns regarding medicines are outlined above.   Tests Ordered: Orders Placed This Encounter  Procedures   Basic metabolic panel   CBC   Hemoglobin A1c   ECHOCARDIOGRAM COMPLETE    Medication Changes: No orders of the defined types were placed in this encounter.   Follow Up: In 6 months in person   Signed, [place], MD  12/31/2021 12:55 PM    [facility]

## 2021-12-31 ENCOUNTER — Encounter: Payer: Self-pay | Admitting: Cardiology

## 2021-12-31 ENCOUNTER — Encounter: Payer: Self-pay | Admitting: *Deleted

## 2021-12-31 ENCOUNTER — Ambulatory Visit: Payer: Medicare Other | Attending: Cardiology | Admitting: Cardiology

## 2021-12-31 VITALS — BP 115/66 | HR 74 | Ht 71.0 in | Wt 240.0 lb

## 2021-12-31 DIAGNOSIS — I5042 Chronic combined systolic (congestive) and diastolic (congestive) heart failure: Secondary | ICD-10-CM

## 2021-12-31 DIAGNOSIS — E785 Hyperlipidemia, unspecified: Secondary | ICD-10-CM

## 2021-12-31 DIAGNOSIS — I2510 Atherosclerotic heart disease of native coronary artery without angina pectoris: Secondary | ICD-10-CM | POA: Diagnosis not present

## 2021-12-31 DIAGNOSIS — I483 Typical atrial flutter: Secondary | ICD-10-CM

## 2021-12-31 DIAGNOSIS — E11319 Type 2 diabetes mellitus with unspecified diabetic retinopathy without macular edema: Secondary | ICD-10-CM | POA: Diagnosis present

## 2021-12-31 DIAGNOSIS — E119 Type 2 diabetes mellitus without complications: Secondary | ICD-10-CM

## 2021-12-31 NOTE — Patient Instructions (Signed)
Medication Instructions:  Your physician recommends that you continue on your current medications as directed. Please refer to the Current Medication list given to you today.  *If you need a refill on your cardiac medications before your next appointment, please call your pharmacy*   Lab Work: Please return for labs in December (when you are back in town) (BMET, CBC, [place])  Our in office lab hours are Monday-Friday 8:00-4:00, closed for lunch 12:45-1:45 pm.  No appointment needed.  [facility] locations:   [facility] - [address]  - [address] ([facility]) - [address] [phone] [address]   [place] - [address]    [place]  - [address]    [place] - [address] - [address] [employer]  - [address] - [address] Chief Technology Officer)  Testing/Procedures: Your physician has requested that you have an echocardiogram in [place]. Echocardiography is a painless test that uses sound waves to create images of your heart. It provides your doctor with information about the size and shape of your heart and how well your heart's chambers and valves are working. This procedure takes approximately one hour. There are no restrictions for this procedure. --PLEASE CALL WHEN YOU KNOW THE DATES YOU ARE COMING HOME/IN TOWN SO THIS CAN BE ARRANGED  Follow-Up: At [facility], you and your health needs are our priority.  As part of our continuing mission to provide you with exceptional heart care, we have created designated Provider Care Teams.  These Care Teams include your primary Cardiologist (physician) and Advanced Practice Providers (APPs -  Physician Assistants and Nurse Practitioners) who all work together to provide you with the care you need, when you need it.  We recommend signing up for the patient portal called "MyChart".   Sign up information is provided on this After Visit Summary.  MyChart is used to connect with patients for Virtual Visits (Telemedicine).  Patients are able to view lab/test results, encounter notes, upcoming appointments, etc.  Non-urgent messages can be sent to your provider as well.   To learn more about what you can do with MyChart, go to [email or web address].    Your next appointment:   December with Dr. Antoine Poche  --please call when you know the dates you are coming home/in town to arrange follow up and testing.

## 2022-02-17 ENCOUNTER — Other Ambulatory Visit (HOSPITAL_BASED_OUTPATIENT_CLINIC_OR_DEPARTMENT_OTHER): Payer: Self-pay | Admitting: Cardiology

## 2022-02-17 DIAGNOSIS — Z7901 Long term (current) use of anticoagulants: Secondary | ICD-10-CM

## 2022-02-17 DIAGNOSIS — I483 Typical atrial flutter: Secondary | ICD-10-CM

## 2022-02-17 NOTE — Telephone Encounter (Signed)
Eliquis 5mg  refill request received. Patient is 76 years old, weight-108.9kg, Crea-1.18 on 04/03/2021, Diagnosis-aflutter, and last seen by Dr. Percival Spanish via [place] on 12/31/2021. Dose is appropriate based on dosing criteria. Will send in refill to requested pharmacy.

## 2022-03-16 ENCOUNTER — Encounter: Payer: Self-pay | Admitting: *Deleted

## 2022-03-27 ENCOUNTER — Other Ambulatory Visit: Payer: Self-pay | Admitting: Cardiology

## 2022-03-27 DIAGNOSIS — E785 Hyperlipidemia, unspecified: Secondary | ICD-10-CM

## 2022-03-28 ENCOUNTER — Other Ambulatory Visit: Payer: Self-pay | Admitting: Cardiology

## 2022-05-15 ENCOUNTER — Other Ambulatory Visit (HOSPITAL_BASED_OUTPATIENT_CLINIC_OR_DEPARTMENT_OTHER): Payer: Self-pay | Admitting: Family

## 2022-05-15 DIAGNOSIS — I5022 Chronic systolic (congestive) heart failure: Secondary | ICD-10-CM

## 2022-07-04 ENCOUNTER — Other Ambulatory Visit: Payer: Self-pay | Admitting: Cardiology

## 2022-07-04 DIAGNOSIS — E785 Hyperlipidemia, unspecified: Secondary | ICD-10-CM

## 2022-08-22 ENCOUNTER — Ambulatory Visit: Payer: Medicare Other | Attending: Cardiology

## 2022-08-22 ENCOUNTER — Ambulatory Visit: Payer: Medicare Other

## 2022-08-22 ENCOUNTER — Other Ambulatory Visit: Payer: Self-pay | Admitting: Cardiology

## 2022-08-22 DIAGNOSIS — I255 Ischemic cardiomyopathy: Secondary | ICD-10-CM | POA: Diagnosis present

## 2022-08-22 DIAGNOSIS — I420 Dilated cardiomyopathy: Secondary | ICD-10-CM | POA: Diagnosis present

## 2022-08-22 DIAGNOSIS — I2510 Atherosclerotic heart disease of native coronary artery without angina pectoris: Secondary | ICD-10-CM | POA: Diagnosis present

## 2022-08-22 DIAGNOSIS — I5042 Chronic combined systolic (congestive) and diastolic (congestive) heart failure: Secondary | ICD-10-CM | POA: Diagnosis present

## 2022-08-22 DIAGNOSIS — E11319 Type 2 diabetes mellitus with unspecified diabetic retinopathy without macular edema: Secondary | ICD-10-CM | POA: Diagnosis present

## 2022-08-22 DIAGNOSIS — I5040 Unspecified combined systolic (congestive) and diastolic (congestive) heart failure: Secondary | ICD-10-CM | POA: Diagnosis present

## 2022-08-22 DIAGNOSIS — E785 Hyperlipidemia, unspecified: Secondary | ICD-10-CM | POA: Diagnosis present

## 2022-08-22 DIAGNOSIS — I4892 Unspecified atrial flutter: Secondary | ICD-10-CM | POA: Diagnosis present

## 2022-08-22 DIAGNOSIS — I483 Typical atrial flutter: Secondary | ICD-10-CM | POA: Diagnosis present

## 2022-08-22 DIAGNOSIS — I5022 Chronic systolic (congestive) heart failure: Secondary | ICD-10-CM | POA: Diagnosis present

## 2022-08-22 DIAGNOSIS — I10 Essential (primary) hypertension: Secondary | ICD-10-CM | POA: Diagnosis present

## 2022-08-22 DIAGNOSIS — Z95810 Presence of automatic (implantable) cardiac defibrillator: Secondary | ICD-10-CM | POA: Diagnosis present

## 2022-08-22 LAB — ECHOCARDIOGRAM COMPLETE
Area-P 1/2: 4.39 cm2
S' Lateral: 3.8 cm

## 2022-08-22 MED ORDER — PERFLUTREN LIPID MICROSPHERE
1.0000 mL | INTRAVENOUS | Status: AC | PRN
Start: 2022-08-22 — End: 2022-08-22
  Administered 2022-08-22: 2 mL via INTRAVENOUS

## 2022-08-22 NOTE — Progress Notes (Signed)
Cardiology Clinic Note   Patient Name: Chad Hahn Date of Encounter: 08/23/2022  Primary Care Provider:  Pcp, No Primary Cardiologist:  [name], MD  Patient Profile    Chad Hahn 77 year old male presents the clinic today for follow-up evaluation of his ischemic CHF, coronary artery disease and essential hypertension.  Past Medical History    Past Medical History:  Diagnosis Date   BPH (benign prostatic hyperplasia)    CAD (coronary artery disease)    CABG age 83 and redo 1996, multiple PCIs as well   Chronic systolic CHF (congestive heart failure) (HCC)    DM2 (diabetes mellitus, type 2) (HCC)    Hypertension    ICD (implantable cardioverter-defibrillator) in place    St jude   Obesity    OSA on CPAP    Past Surgical History:  Procedure Laterality Date   BYPASS GRAFT ANGIOGRAPHY     CARDIOVERSION N/A 12/01/2020   Procedure: CARDIOVERSION;  Surgeon: Lewayne Bunting, MD;  Location: [facility];  Service: Cardiovascular;  Laterality: N/A;   CHOLECYSTECTOMY     PACEMAKER IMPLANT      Allergies  No Known Allergies  History of Present Illness    Chad Hahn has a PMH of HTN, ischemic cardiomyopathy, idiopathic pulmonary artery hypertension, acute on chronic combined systolic and diastolic CHF, CAD status post CABG (age 42 and redo 1996), underwent catheterizations with 2 stents placed in 2012.  OSA, atrial flutter, diabetes, and dyslipidemia.  Echocardiogram 10/22/2020 showed an LVEF of 20-25%, mild concentric left ventricular hypertrophy, trivial mitral valve regurgitation.  He received [facility] Jude CRT-D 2013 and had generator change out.  He was admitted to the hospital 6/22 with worsening dyspnea.  He was found to be in atrial flutter.  His device was interrogated and he was noted to be in atrial flutter since 3/22.  His echocardiogram 6/22 showed an LVEF of 25-30%, akinesis and aneurysmal apex, and a dilated IVC.  He received IV diuresis and his ACE  inhibitor was transition to [employer].  Jardiance was started as well as spironolactone.  He was started on apixaban.  He was [facility] at [facility] and was noted to be orthostatic with blood pressures in the 90s systolic.  His furosemide was transition to as needed.  He followed up with Gillian Shields, NP-C on 7/22.  He underwent successful DCCV on 12/01/2020.  He received 1 shock with 120 J x 1.  His EKG showed AV paced rhythm.  He was seen in follow-up by Dr. Antoine Poche on 12/31/2021.  During that time he remained stable from a cardiac standpoint.  He was walking regularly.  He did note occasional episodes of lightheadedness with some episodes of low blood pressure.  He denied presyncope and syncope.  He denied chest pain, orthopnea and PND.  He had no weight gain or lower extremity swelling.  He was unable to do remote device checks.  Follow-up was planned for 6 months.  He presents to the clinic today for follow-up evaluation and state he has been noticing lightheadedness for the past several months.  He reports that he has been more physically active and has lost 25 pounds over that timeframe.  His repeat echocardiogram showed improved EF.  His blood pressure today is 88/52.  His EKG shows V paced rhythm 70 bpm.  We reviewed his previous cardiac history and lab work.  He and his wife expressed understanding.  I will reduce his spironolactone to 12.5 mg daily and have him  continue to monitor his blood pressure.  He remains very physically active and travels out of state for work regularly.  Will plan follow-up in 6 months.  Today he denies chest pain, shortness of breath, lower extremity edema, fatigue, palpitations, melena, hematuria, hemoptysis, diaphoresis, weakness, presyncope, syncope, orthopnea, and PND.    Home Medications    Prior to Admission medications   Medication Sig Start Date End Date Taking? Authorizing Provider  apixaban (ELIQUIS) 5 MG TABS tablet TAKE 1 TABLET TWICE DAILY 02/17/22    [name], MD  carvedilol (COREG) 25 MG tablet Take 0.5 tablets (12.5 mg total) by mouth 2 (two) times daily with a meal. 11/20/20   Alver Sorrow, NP  clopidogrel (PLAVIX) 75 MG tablet Take 75 mg by mouth every morning.    [provider]  ENTRESTO 49-51 MG TAKE 1 TABLET TWICE DAILY 03/30/22   [name], MD  ezetimibe (ZETIA) 10 MG tablet Take 1 tablet by mouth once daily 07/04/22   [name], MD  finasteride (PROSCAR) 5 MG tablet Take 5 mg by mouth every evening.    [provider]  furosemide (LASIX) 20 MG tablet Take 1 tablet (20 mg total) by mouth daily. 03/05/21 12/31/21  [name], MD  JARDIANCE 10 MG TABS tablet TAKE 1 TABLET EVERY DAY BEFORE BREAKFAST 05/17/22   [name], MD  metFORMIN (GLUCOPHAGE-XR) 500 MG 24 hr tablet Take 500 mg by mouth 2 (two) times daily.    [provider]  nitroGLYCERIN (NITROSTAT) 0.4 MG SL tablet Place 0.4 mg under the tongue every 5 (five) minutes x 3 doses as needed for chest pain.    [provider]  pantoprazole (PROTONIX) 40 MG tablet Take 40 mg by mouth daily. 12/25/21   [provider]  rosuvastatin (CRESTOR) 40 MG tablet Take 40 mg by mouth at bedtime.    [provider]  spironolactone (ALDACTONE) 25 MG tablet TAKE 1 TABLET EVERY DAY 05/17/22   [name], MD    Family History    Family History  Problem Relation Age of Onset   Heart disease Mother    Heart disease Paternal Uncle    He indicated that the status of his mother is unknown. He indicated that his paternal uncle is alive.  Social History    Social History   Socioeconomic History   Marital status: Married    Spouse name: Not on file   Number of children: Not on file   Years of education: Not on file   Highest education level: Not on file  Occupational History   Not on file  Tobacco Use   Smoking status: Never   Smokeless tobacco: Never  Vaping Use   Vaping Use: Never used  Substance and  Sexual Activity   Alcohol use: Not Currently   Drug use: Never   Sexual activity: Not on file  Other Topics Concern   Not on file  Social History Narrative   Not on file   Social Determinants of Health   Financial Resource Strain: Not on file  Food Insecurity: Not on file  Transportation Needs: Not on file  Physical Activity: Not on file  Stress: Not on file  Social Connections: Not on file  Intimate Partner Violence: Not on file     Review of Systems    General:  No chills, fever, night sweats or weight changes.  Cardiovascular:  No chest pain, dyspnea on exertion, edema, orthopnea, palpitations, paroxysmal nocturnal dyspnea. Dermatological: No rash, lesions/masses  Respiratory: No cough, dyspnea Urologic: No hematuria, dysuria Abdominal:   No nausea, vomiting, diarrhea, bright red blood per rectum, melena, or hematemesis Neurologic:  No visual changes, wkns, changes in mental status. All other systems reviewed and are otherwise negative except as noted above.  Physical Exam    VS:  BP (!) 88/52   Pulse 70   Ht 5\' 11"  (1.803 m)   Wt 234 lb (106.1 kg)   SpO2 95%   BMI 32.64 kg/m  , BMI Body mass index is 32.64 kg/m. GEN: Well nourished, well developed, in no acute distress. HEENT: normal. Neck: Supple, no JVD, carotid bruits, or masses. Cardiac: RRR, no murmurs, rubs, or gallops. No clubbing, cyanosis, edema.  Radials/DP/PT 2+ and equal bilaterally.  Respiratory:  Respirations regular and unlabored, clear to auscultation bilaterally. GI: Soft, nontender, nondistended, BS + x 4. MS: no deformity or atrophy. Skin: warm and dry, no rash. Neuro:  Strength and sensation are intact. Psych: Normal affect.  Accessory Clinical Findings    Recent Labs: 08/22/2022: BUN 27; Creatinine, Ser 1.21; Hemoglobin 13.6; Platelets 145; Potassium 4.7; Sodium 141   Recent Lipid Panel    Component Value Date/Time   CHOL 102 11/30/2021 1143   TRIG 119 11/30/2021 1143   HDL 36 (L)  11/30/2021 1143   CHOLHDL 2.8 11/30/2021 1143   CHOLHDL 4.0 11/02/2020 1003   VLDL 20 11/02/2020 1003   LDLCALC 44 11/30/2021 1143         ECG personally reviewed by me today-V paced rhythm 70 bpm  Echocardiogram 10/22/2020  IMPRESSIONS     1. 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 The myocardium in  this region appears thinned consistent with prior infarction. No apical  thrombus on contrast imaging. All other LV segments are hypokinetic. EF  severely reduced ~20-25%. Left  ventricular ejection fraction, by estimation, is 20 to 25%. The left  ventricle has severely decreased function. The left ventricle demonstrates  regional wall motion abnormalities (see scoring diagram/findings for  description). There is mild concentric  left ventricular hypertrophy. Indeterminate diastolic filling due to E-A  fusion.   2. Right ventricular systolic function is severely reduced. The right  ventricular size is normal. There is normal pulmonary artery systolic  pressure. The estimated right ventricular systolic pressure is 33.8 mmHg.   3. The mitral valve is grossly normal. Trivial mitral valve  regurgitation. No evidence of mitral stenosis.   4. The aortic valve is grossly normal. Aortic valve regurgitation is not  visualized. No aortic stenosis is present.   5. The inferior vena cava is dilated in size with <50% respiratory  variability, suggesting right atrial pressure of 15 mmHg.   Conclusion(s)/Recommendation(s): Findings consistent with ischemic  cardiomyopathy.   FINDINGS   Left Ventricle: The entire apex is akinetic and appears aneurysmal. The  myocardium in this region appears thinned consistent with prior  infarction. No apical thrombus on contrast imaging. All other LV segments  are hypokinetic. EF severely reduced  ~20-25%. Left ventricular ejection fraction, by estimation, is 20 to 25%.  The left ventricle has severely decreased function. The left ventricle   demonstrates regional wall motion abnormalities. The left ventricular  internal cavity size was normal in  size. There is mild concentric left ventricular hypertrophy. Indeterminate  diastolic filling due to E-A fusion.     LV Wall Scoring:  The entire apex is akinetic.   Right Ventricle: The right ventricular size is normal. No increase in  right ventricular wall thickness.  Right ventricular systolic function is  severely reduced. There is normal pulmonary artery systolic pressure. The  tricuspid regurgitant velocity is  2.17 m/s, and with an assumed right atrial pressure of 15 mmHg, the  estimated right ventricular systolic pressure is 33.8 mmHg.   Left Atrium: Left atrial size was normal in size.   Right Atrium: Right atrial size was normal in size.   Pericardium: Trivial pericardial effusion is present.   Mitral Valve: The mitral valve is grossly normal. Trivial mitral valve  regurgitation. No evidence of mitral valve stenosis.   Tricuspid Valve: The tricuspid valve is grossly normal. Tricuspid valve  regurgitation is mild . No evidence of tricuspid stenosis.   Aortic Valve: The aortic valve is grossly normal. Aortic valve  regurgitation is not visualized. No aortic stenosis is present.   Pulmonic Valve: The pulmonic valve was grossly normal. Pulmonic valve  regurgitation is not visualized.   Aorta: The aortic root and ascending aorta are structurally normal, with  no evidence of dilitation.   Venous: The inferior vena cava is dilated in size with less than 50%  respiratory variability, suggesting right atrial pressure of 15 mmHg.   IAS/Shunts: The atrial septum is grossly normal.    Assessment & Plan   1.  Coronary artery disease-no chest pain today.  Denies recent episodes of arm neck back or chest discomfort. Continue current medical therapy. Maintain physical activity Low-sodium diet  Atrial flutter-denies recent episodes of palpitations, irregular or  accelerated heartbeat.  Reports compliance with apixaban.  Denies bleeding issues.  Status post DCCV 8/22. Avoid triggers caffeine, chocolate, EtOH, dehydration etc. Continue apixaban, carvedilol   Ischemic congestive cardiomyopathy, chronic combined systolic and diastolic CHF-weight stable.  Euvolemic.  Maintaining activity tolerance. Continue Jardiance, furosemide as needed, Entresto, carvedilol Daily weights-weight log-contact office with a weight increase of 2 to 3 pounds overnight or 5 pounds in 1 week.  CRT-D-implanted in 2013. Requires in office interrogation  Essential hypertension, lightheadedness-BP today 88/52. Maintain blood pressure log Continue carvedilol, Entresto Reduce spironolactone to 12.5 mg daily Low-sodium diet  Diabetes-reports compliance with Jardiance and metformin. Follows with PCP  Disposition: Follow-up with Dr. Antoine Poche in 6 months.   Thomasene Ripple. Kosisochukwu Burningham NP-C     08/23/2022, 2:56 PM [facility] [address] Office [phone] Fax [phone]    I spent 14 minutes examining this patient, reviewing medications, and using patient centered shared decision making involving her cardiac care.  Prior to her visit I spent greater than 20 minutes reviewing her past medical history,  medications, and prior cardiac tests.

## 2022-08-23 ENCOUNTER — Other Ambulatory Visit: Payer: Medicare Other

## 2022-08-23 ENCOUNTER — Ambulatory Visit (INDEPENDENT_AMBULATORY_CARE_PROVIDER_SITE_OTHER): Payer: Medicare Other | Admitting: Cardiology

## 2022-08-23 ENCOUNTER — Encounter: Payer: Medicare Other | Admitting: Cardiology

## 2022-08-23 ENCOUNTER — Encounter: Payer: Self-pay | Admitting: Cardiology

## 2022-08-23 ENCOUNTER — Ambulatory Visit (INDEPENDENT_AMBULATORY_CARE_PROVIDER_SITE_OTHER): Payer: Medicare Other | Admitting: General Practice

## 2022-08-23 ENCOUNTER — Telehealth: Payer: Self-pay

## 2022-08-23 ENCOUNTER — Encounter: Payer: Self-pay | Admitting: General Practice

## 2022-08-23 VITALS — BP 88/52 | HR 70 | Ht 71.0 in | Wt 234.0 lb

## 2022-08-23 VITALS — BP 98/50 | HR 78 | Ht 71.0 in | Wt 234.0 lb

## 2022-08-23 DIAGNOSIS — I10 Essential (primary) hypertension: Secondary | ICD-10-CM | POA: Insufficient documentation

## 2022-08-23 DIAGNOSIS — Z95810 Presence of automatic (implantable) cardiac defibrillator: Secondary | ICD-10-CM

## 2022-08-23 DIAGNOSIS — I255 Ischemic cardiomyopathy: Secondary | ICD-10-CM | POA: Diagnosis not present

## 2022-08-23 DIAGNOSIS — I2510 Atherosclerotic heart disease of native coronary artery without angina pectoris: Secondary | ICD-10-CM

## 2022-08-23 DIAGNOSIS — E11319 Type 2 diabetes mellitus with unspecified diabetic retinopathy without macular edema: Secondary | ICD-10-CM

## 2022-08-23 DIAGNOSIS — I5040 Unspecified combined systolic (congestive) and diastolic (congestive) heart failure: Secondary | ICD-10-CM | POA: Diagnosis not present

## 2022-08-23 DIAGNOSIS — I5042 Chronic combined systolic (congestive) and diastolic (congestive) heart failure: Secondary | ICD-10-CM

## 2022-08-23 DIAGNOSIS — I483 Typical atrial flutter: Secondary | ICD-10-CM | POA: Insufficient documentation

## 2022-08-23 DIAGNOSIS — I5022 Chronic systolic (congestive) heart failure: Secondary | ICD-10-CM

## 2022-08-23 DIAGNOSIS — I4892 Unspecified atrial flutter: Secondary | ICD-10-CM

## 2022-08-23 DIAGNOSIS — I420 Dilated cardiomyopathy: Secondary | ICD-10-CM | POA: Insufficient documentation

## 2022-08-23 DIAGNOSIS — E785 Hyperlipidemia, unspecified: Secondary | ICD-10-CM | POA: Insufficient documentation

## 2022-08-23 LAB — CUP PACEART INCLINIC DEVICE CHECK
Battery Remaining Longevity: 32 mo
Brady Statistic RA Percent Paced: 27 %
Brady Statistic RV Percent Paced: 98 %
Date Time Interrogation Session: 20240430164123
HighPow Impedance: 73.125
Implantable Lead Connection Status: 753985
Implantable Lead Connection Status: 753985
Implantable Lead Connection Status: 753985
Implantable Lead Implant Date: 20131001
Implantable Lead Implant Date: 20131001
Implantable Lead Implant Date: 20131001
Implantable Lead Location: 753858
Implantable Lead Location: 753859
Implantable Lead Location: 753860
Implantable Pulse Generator Implant Date: 20200922
Lead Channel Impedance Value: 337.5 Ohm
Lead Channel Impedance Value: 412.5 Ohm
Lead Channel Impedance Value: 837.5 Ohm
Lead Channel Pacing Threshold Amplitude: 0.75 V
Lead Channel Pacing Threshold Amplitude: 0.75 V
Lead Channel Pacing Threshold Amplitude: 0.75 V
Lead Channel Pacing Threshold Amplitude: 1.75 V
Lead Channel Pacing Threshold Amplitude: 1.75 V
Lead Channel Pacing Threshold Pulse Width: 0.5 ms
Lead Channel Pacing Threshold Pulse Width: 0.5 ms
Lead Channel Pacing Threshold Pulse Width: 0.5 ms
Lead Channel Pacing Threshold Pulse Width: 1.5 ms
Lead Channel Pacing Threshold Pulse Width: 1.5 ms
Lead Channel Sensing Intrinsic Amplitude: 10.4 mV
Lead Channel Sensing Intrinsic Amplitude: 5 mV
Lead Channel Setting Pacing Amplitude: 2 V
Lead Channel Setting Pacing Amplitude: 2 V
Lead Channel Setting Pacing Amplitude: 2.625
Lead Channel Setting Pacing Pulse Width: 0.5 ms
Lead Channel Setting Pacing Pulse Width: 1.5 ms
Lead Channel Setting Sensing Sensitivity: 0.5 mV
Pulse Gen Serial Number: 9911657
Zone Setting Status: 755011

## 2022-08-23 LAB — CBC
Hematocrit: 42.3 % (ref 37.5–51.0)
Hemoglobin: 13.6 g/dL (ref 13.0–17.7)
MCH: 29.8 pg (ref 26.6–33.0)
MCHC: 32.2 g/dL (ref 31.5–35.7)
MCV: 93 fL (ref 79–97)
Platelets: 145 10*3/uL — ABNORMAL LOW (ref 150–450)
RBC: 4.57 x10E6/uL (ref 4.14–5.80)
RDW: 14.4 % (ref 11.6–15.4)
WBC: 4.4 10*3/uL (ref 3.4–10.8)

## 2022-08-23 LAB — BASIC METABOLIC PANEL
BUN/Creatinine Ratio: 22 (ref 10–24)
BUN: 27 mg/dL (ref 8–27)
CO2: 19 mmol/L — ABNORMAL LOW (ref 20–29)
Calcium: 9.4 mg/dL (ref 8.6–10.2)
Chloride: 107 mmol/L — ABNORMAL HIGH (ref 96–106)
Creatinine, Ser: 1.21 mg/dL (ref 0.76–1.27)
Glucose: 128 mg/dL — ABNORMAL HIGH (ref 70–99)
Potassium: 4.7 mmol/L (ref 3.5–5.2)
Sodium: 141 mmol/L (ref 134–144)
eGFR: 62 mL/min/{1.73_m2} (ref >59)

## 2022-08-23 LAB — HEMOGLOBIN A1C
Est. average glucose Bld gHb Est-mCnc: 163 mg/dL
Hgb A1c MFr Bld: 7.3 % — ABNORMAL HIGH (ref 4.8–5.6)

## 2022-08-23 MED ORDER — SPIRONOLACTONE 25 MG PO TABS
12.5000 mg | ORAL_TABLET | Freq: Every day | ORAL | 3 refills | Status: DC
Start: 2022-08-23 — End: 2023-04-20

## 2022-08-23 NOTE — Patient Instructions (Signed)
Medication Instructions:  DECREASE SPIRONOLACTONE 12.5MG  DAILY *If you need a refill on your cardiac medications before your next appointment, please call your pharmacy*  Lab Work: NONE If you have labs (blood work) drawn today and your tests are completely normal, you will receive your results only by: MyChart Message (if you have MyChart) OR A paper copy in the mail If you have any lab test that is abnormal or we need to change your treatment, we will call you to review the results.  Testing/Procedures: NONE  Follow-Up: At [facility], you and your health needs are our priority.  As part of our continuing mission to provide you with exceptional heart care, we have created designated Provider Care Teams.  These Care Teams include your primary Cardiologist (physician) and Advanced Practice Providers (APPs -  Physician Assistants and Nurse Practitioners) who all work together to provide you with the care you need, when you need it.  Your next appointment:   4-6  month(s)  Provider:   [name], MD     Other Instructions CONTINUE YOUR PHYSICAL ACTIVITY  PLEASE READ AND FOLLOW ATTACHED  SALTY 6

## 2022-08-23 NOTE — Progress Notes (Signed)
Electrophysiology Office Note   Date:  08/23/2022   ID:  Chad Hahn, DOB 08/02/1945, MRN [id number]  PCP:  Pcp, No  Cardiologist:  hochrein Primary Electrophysiologist:  Daziya Redmond Jorja Loa, MD    Chief Complaint: CHF   History of Present Illness: Chad Hahn is a 77 y.o. male who is being seen today for the evaluation of CHF at the request of Chad Rotunda, MD. Presenting today for electrophysiology evaluation.  He has a history seen for chronic systolic heart failure status post [facility] Jude CRT-D implanted in 2013 with generator change in 2020, coronary artery disease post CABG and multiple PCI's, type 2 diabetes, hypertension, OSA on CPAP, BPH, atrial flutter.  He had CABG at age 21 and redo CABG in 1996.  He has atrial flutter.  He had a cardioversion 12/01/2020.  Today, he denies symptoms of palpitations, chest pain, shortness of breath, orthopnea, PND, lower extremity edema, claudication, dizziness, presyncope, syncope, bleeding, or neurologic sequela. The patient is tolerating medications without difficulties.  He is able to exert himself.  He has no chest pain or shortness of breath.  He did go into atrial flutter approximately 10 months ago.  He is unsure as to whether or not he is symptomatic.   Past Medical History:  Diagnosis Date   BPH (benign prostatic hyperplasia)    CAD (coronary artery disease)    CABG age 68 and redo 1996, multiple PCIs as well   Chronic systolic CHF (congestive heart failure) (HCC)    DM2 (diabetes mellitus, type 2) (HCC)    Hypertension    ICD (implantable cardioverter-defibrillator) in place    St jude   Obesity    OSA on CPAP    Past Surgical History:  Procedure Laterality Date   BYPASS GRAFT ANGIOGRAPHY     CARDIOVERSION N/A 12/01/2020   Procedure: CARDIOVERSION;  Surgeon: Lewayne Bunting, MD;  [facility];  Service: Cardiovascular;  Laterality: N/A;   CHOLECYSTECTOMY     PACEMAKER IMPLANT       Current Outpatient  Medications  Medication Sig Dispense Refill   apixaban (ELIQUIS) 5 MG TABS tablet TAKE 1 TABLET TWICE DAILY 180 tablet 1   carvedilol (COREG) 25 MG tablet Take 0.5 tablets (12.5 mg total) by mouth 2 (two) times daily with a meal.     clopidogrel (PLAVIX) 75 MG tablet Take 75 mg by mouth every morning.     ENTRESTO 49-51 MG TAKE 1 TABLET TWICE DAILY 180 tablet 3   ezetimibe (ZETIA) 10 MG tablet Take 1 tablet by mouth once daily 90 tablet 0   finasteride (PROSCAR) 5 MG tablet Take 5 mg by mouth every evening.     JARDIANCE 10 MG TABS tablet TAKE 1 TABLET EVERY DAY BEFORE BREAKFAST 90 tablet 3   metFORMIN (GLUCOPHAGE-XR) 500 MG 24 hr tablet Take 500 mg by mouth 2 (two) times daily.     nitroGLYCERIN (NITROSTAT) 0.4 MG SL tablet Place 0.4 mg under the tongue every 5 (five) minutes x 3 doses as needed for chest pain.     rosuvastatin (CRESTOR) 40 MG tablet Take 40 mg by mouth at bedtime.     spironolactone (ALDACTONE) 25 MG tablet Take 0.5 tablets (12.5 mg total) by mouth daily. 90 tablet 3   furosemide (LASIX) 20 MG tablet Take 1 tablet (20 mg total) by mouth daily. 30 tablet 0   No current facility-administered medications for this visit.    Allergies:   Patient has no known allergies.  Social History:  The patient  reports that he has never smoked. He has never used smokeless tobacco. He reports that he does not currently use alcohol. He reports that he does not use drugs.   Family History:  The patient's family history includes Heart disease in his mother and paternal uncle.    ROS:  Please see the history of present illness.   Otherwise, review of systems is positive for none.   All other systems are reviewed and negative.    PHYSICAL EXAM: VS:  BP (!) 98/50   Pulse 78   Ht 5\' 11"  (1.803 m)   Wt 234 lb (106.1 kg)   SpO2 97%   BMI 32.64 kg/m  , BMI Body mass index is 32.64 kg/m. GEN: Well nourished, well developed, in no acute distress  HEENT: normal  Neck: no JVD, carotid  bruits, or masses Cardiac: RRR; no murmurs, rubs, or gallops,no edema  Respiratory:  clear to auscultation bilaterally, normal work of breathing GI: soft, nontender, nondistended, + BS MS: no deformity or atrophy  Skin: warm and dry, device pocket is well healed Neuro:  Strength and sensation are intact Psych: euthymic mood, full affect  EKG:  EKG is ordered today. Personal review of the ekg ordered shows atrial fltuter, V paced  Device interrogation is reviewed today in detail.  See PaceArt for details.   Recent Labs: 08/22/2022: BUN 27; Creatinine, Ser 1.21; Hemoglobin 13.6; Platelets 145; Potassium 4.7; Sodium 141    Lipid Panel     Component Value Date/Time   CHOL 102 11/30/2021 1143   TRIG 119 11/30/2021 1143   HDL 36 (L) 11/30/2021 1143   CHOLHDL 2.8 11/30/2021 1143   CHOLHDL 4.0 11/02/2020 1003   VLDL 20 11/02/2020 1003   LDLCALC 44 11/30/2021 1143     Wt Readings from Last 3 Encounters:  08/23/22 234 lb (106.1 kg)  08/23/22 234 lb (106.1 kg)  12/31/21 240 lb (108.9 kg)      Other studies Reviewed: Additional studies/ records that were reviewed today include: TTE 08/22/22  Review of the above records today demonstrates:   1. 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 Left ventricular ejection fraction, by estimation, is 30 to  35%. The left ventricle has moderately decreased function. The left  ventricle demonstrates regional wall motion   abnormalities (see scoring diagram/findings for description). The left  ventricular internal cavity size was moderately dilated. Left ventricular  diastolic parameters were normal.   2. Device leads noted in RA/RV . Right ventricular systolic function is  normal. The right ventricular size is normal.   3. Left atrial size was moderately dilated.   4. The mitral valve is abnormal. Mild mitral valve regurgitation. No  evidence of mitral stenosis.   5. The aortic valve is tricuspid. There  is mild calcification of the  aortic valve. Aortic valve regurgitation is not visualized. Aortic valve  sclerosis is present, with no evidence of aortic valve stenosis.   6. The inferior vena cava is normal in size with greater than 50%  respiratory variability, suggesting right atrial pressure of 3 mmHg.    ASSESSMENT AND PLAN:  1.  Chronic systolic heart failure: Due to ischemic cardiomyopathy.  Currently on optimal medical therapy per primary cardiology.  Status post [facility] Jude CRT-D.  Sensing, threshold, impedance within normal limits.  No changes.  2.  Coronary artery disease: Status post CABG.  No current chest pain.  3.  Atrial flutter:  CHA2DS2-VASc of at least 5.  Continue Eliquis.  Has been in atrial flutter for the last 10 months.  He does not have clear symptoms.  Savina Olshefski plan for cardioversion.  If he feels improved after cardioversion, we Emilianna Barlowe plan for rhythm control, otherwise, we Duncan Alejandro plan for rate control.    Current medicines are reviewed at length with the patient today.   The patient does not have concerns regarding his medicines.  The following changes were made today:  none  Labs/ tests ordered today include:  No orders of the defined types were placed in this encounter.    Disposition:   FU 3 months  Signed, Shad Ledvina Jorja Loa, MD  08/23/2022 3:51 PM     [facility] [address] [place] [address] [phone] (office) [phone] (fax)

## 2022-08-23 NOTE — Patient Instructions (Addendum)
Medication Instructions:  Your physician recommends that you continue on your current medications as directed. Please refer to the Current Medication list given to you today.  *If you need a refill on your cardiac medications before your next appointment, please call your pharmacy*   Lab Work: None ordered   Testing/Procedures: Your physician has recommended that you have a Cardioversion (DCCV). Electrical Cardioversion uses a jolt of electricity to your heart either through paddles or wired patches attached to your chest. This is a controlled, usually prescheduled, procedure. Defibrillation is done under light anesthesia in the hospital, and you usually go home the day of the procedure. This is done to get your heart back into a normal rhythm. You are not awake for the procedure. Please see the instructions below located under other instructions.   Follow-Up: At [facility], you and your health needs are our priority.  As part of our continuing mission to provide you with exceptional heart care, we have created designated Provider Care Teams.  These Care Teams include your primary Cardiologist (physician) and Advanced Practice Providers (APPs -  Physician Assistants and Nurse Practitioners) who all work together to provide you with the care you need, when you need it.  Your next appointment:   August   The format for your next appointment:   In Person  Provider:   You will see one of the following Advanced Practice Providers on your designated Care Team:   Francis Dowse, [place] Casimiro Needle "Mardelle Matte" Lanna Poche, [place]  Thank you for choosing [facility]!!   Dory Horn, RN [phone]        Dear Adair Laundry  You are scheduled for a  on Friday, June 7 with Dr. Duke Salvia.  Please arrive at the [facility] (Main Entrance A) at [facility]: [address] [place], [place] [address] at 8:00 AM (This time is 1 hour(s) before your procedure to ensure your preparation). Free valet  parking service is available. You will check in at ADMITTING. The support person will be asked to wait in the waiting room.  It is OK to have someone drop you off and come back when you are ready to be discharged.      DIET:  Nothing to eat or drink after midnight except a sip of water with medications (see medication instructions below)  MEDICATION INSTRUCTIONS: !!IF ANY NEW MEDICATIONS ARE STARTED AFTER TODAY, PLEASE NOTIFY YOUR PROVIDER AS SOON AS POSSIBLE!!  FYI: Medications such as Semaglutide (Ozempic, [place]), Tirzepatide (Mounjaro, Zepbound), Dulaglutide (Trulicity), etc ("GLP1 agonists") must be held around the time of a procedure. Talk to your provider if you take one of these.  Hold your Jardiance for 3 days prior to this procedure. Hold your morning medications, ONLY TAKE YOUR ELIQUIS  Continue taking your anticoagulant (blood thinner): Apixaban (Eliquis).  You will need to continue this after your procedure until you are told by your provider that it is safe to stop.    LABS:  Your labs will be done at the hospital prior to your procedure  FYI:  For your safety, and to allow [place] to monitor your vital signs accurately during the surgery/procedure we request: If you have artificial nails, gel coating, SNS etc, please have those removed prior to your surgery/procedure. Not having the nail coverings /polish removed may result in cancellation or delay of your surgery/procedure.  You must have a responsible person to drive you home and stay in the waiting area during your procedure. Failure to do so  could result in cancellation.  Bring your insurance cards.  *Special Note: Every effort is made to have your procedure done on time. Occasionally there are emergencies that occur at the hospital that may cause delays. Please be patient if a delay does occur.

## 2022-08-23 NOTE — Telephone Encounter (Signed)
I ordered the patient a cell adapter. He should receive it in 7-10 business days.  I sent it to the following address: [address]eta Lucks [address] [place], [place] [address].

## 2022-08-29 ENCOUNTER — Encounter: Payer: Self-pay | Admitting: *Deleted

## 2022-09-19 NOTE — Pre-Procedure Instructions (Signed)
Patient takes [place].  This drug has to be held 72 hours before anesthesia procedure.  Patient aware.  Jardiance-  last dose witll be Monday 09/26/22

## 2022-09-23 ENCOUNTER — Ambulatory Visit: Payer: Medicare Other | Attending: Cardiology | Admitting: Cardiology

## 2022-09-23 ENCOUNTER — Encounter: Payer: Self-pay | Admitting: Cardiology

## 2022-09-23 DIAGNOSIS — I484 Atypical atrial flutter: Secondary | ICD-10-CM | POA: Diagnosis present

## 2022-09-23 DIAGNOSIS — I5022 Chronic systolic (congestive) heart failure: Secondary | ICD-10-CM | POA: Insufficient documentation

## 2022-09-23 DIAGNOSIS — I2510 Atherosclerotic heart disease of native coronary artery without angina pectoris: Secondary | ICD-10-CM | POA: Diagnosis present

## 2022-09-23 NOTE — Progress Notes (Signed)
Electrophysiology TeleHealth Note    Date:  09/23/2022   ID:  Chad Hahn, DOB Feb 27, 1946, MRN [id number]  Location: patient's home  Provider location: [address], [place]  Evaluation Performed: Follow-up visit  PCP:  Pcp, No  Cardiologist:  [name], MD  Electrophysiologist:  Dr Elberta Fortis  Chief Complaint:  atrial flutter  History of Present Illness:    Chad Hahn is a 77 y.o. male who presents via audio/video conferencing for a telehealth visit today.  Since last being seen in our clinic, the patient reports doing very well.  Today, he denies symptoms of palpitations, chest pain, shortness of breath,  lower extremity edema, dizziness, presyncope, or syncope.  The patient is otherwise without complaint today.   Today he feels well.  He has no chest pain or shortness of breath.  He continues to do all of his daily activities.  He is mildly fatigued.  Today, denies symptoms of palpitations, chest pain, shortness of breath, orthopnea, PND, lower extremity edema, claudication, dizziness, presyncope, syncope, bleeding, or neurologic sequela. The patient is tolerating medications without difficulties.    Past Medical History:  Diagnosis Date   BPH (benign prostatic hyperplasia)    CAD (coronary artery disease)    CABG age 4 and redo 1996, multiple PCIs as well   Chronic systolic CHF (congestive heart failure) (HCC)    DM2 (diabetes mellitus, type 2) (HCC)    Hypertension    ICD (implantable cardioverter-defibrillator) in place    St jude   Obesity    OSA on CPAP     Past Surgical History:  Procedure Laterality Date   BYPASS GRAFT ANGIOGRAPHY     CARDIOVERSION N/A 12/01/2020   Procedure: CARDIOVERSION;  Surgeon: Lewayne Bunting, MD;  Location: [facility];  Service: Cardiovascular;  Laterality: N/A;   CHOLECYSTECTOMY     PACEMAKER IMPLANT      Current Outpatient Medications  Medication Sig Dispense Refill   apixaban (ELIQUIS) 5 MG TABS tablet  TAKE 1 TABLET TWICE DAILY 180 tablet 1   carvedilol (COREG) 25 MG tablet Take 0.5 tablets (12.5 mg total) by mouth 2 (two) times daily with a meal.     clopidogrel (PLAVIX) 75 MG tablet Take 75 mg by mouth every morning.     ENTRESTO 49-51 MG TAKE 1 TABLET TWICE DAILY 180 tablet 3   ezetimibe (ZETIA) 10 MG tablet Take 1 tablet by mouth once daily 90 tablet 0   finasteride (PROSCAR) 5 MG tablet Take 5 mg by mouth every evening.     furosemide (LASIX) 20 MG tablet Take 1 tablet (20 mg total) by mouth daily. 30 tablet 0   JARDIANCE 10 MG TABS tablet TAKE 1 TABLET EVERY DAY BEFORE BREAKFAST 90 tablet 3   metFORMIN (GLUCOPHAGE-XR) 500 MG 24 hr tablet Take 500 mg by mouth 2 (two) times daily.     nitroGLYCERIN (NITROSTAT) 0.4 MG SL tablet Place 0.4 mg under the tongue every 5 (five) minutes x 3 doses as needed for chest pain.     rosuvastatin (CRESTOR) 40 MG tablet Take 40 mg by mouth at bedtime.     spironolactone (ALDACTONE) 25 MG tablet Take 0.5 tablets (12.5 mg total) by mouth daily. 90 tablet 3   No current facility-administered medications for this visit.    Allergies:   Patient has no known allergies.   Social History:  The patient  reports that he has never smoked. He has never used smokeless tobacco. He reports that  he does not currently use alcohol. He reports that he does not use drugs.   Family History:  The patient's  family history includes Heart disease in his mother and paternal uncle.   ROS:  Please see the history of present illness.   All other systems are personally reviewed and negative.    Exam:    Vital Signs:  There were no vitals taken for this visit.  Well appearing, alert and conversant, regular work of breathing,  good skin color Eyes- anicteric, neuro- grossly intact, skin- no apparent rash or lesions or cyanosis, mouth- oral mucosa is pink  Labs/Other Tests and Data Reviewed:    Recent Labs: 08/22/2022: BUN 27; Creatinine, Ser 1.21; Hemoglobin 13.6; Platelets  145; Potassium 4.7; Sodium 141   Wt Readings from Last 3 Encounters:  08/23/22 234 lb (106.1 kg)  08/23/22 234 lb (106.1 kg)  12/31/21 240 lb (108.9 kg)     Other studies personally reviewed: Additional studies/ records that were reviewed today include: ECG: atrial flutter   ASSESSMENT & PLAN:    1.  Atrial flutter: CHA2DS2-VASc of 5.  Currently on Eliquis.  Had been in atrial flutter for approximately 10 months.  Does not have clear symptoms.  Robena Ewy plan for cardioversion.  If he does not have a change in his symptoms, Charmagne Buhl likely plan for a rate control strategy.  2.  Coronary disease: Post CABG.  No current chest pain.  3.  Chronic systolic heart failure: Due to ischemic cardiomyopathy.  Post [facility] Jude CRT-D.  Device functioning appropriately.   Current medicines are reviewed at length with the patient today.   The patient does not have concerns regarding his medicines.  The following changes were made today:  none  Labs/ tests ordered today include:  No orders of the defined types were placed in this encounter.    Patient Risk:  after full review of this patients clinical status, I feel that they are at moderate risk at this time.     Signed, Nghia Mcentee Jorja Loa, MD  09/23/2022 4:39 PM     [facility] [address] [place] [address] [phone] (office) [phone] (fax)

## 2022-09-23 NOTE — H&P (View-Only) (Signed)
   Electrophysiology TeleHealth Note    Date:  09/23/2022   ID:  Chad Hahn, DOB 08/29/1945, MRN [id number]  Location: patient's home  Provider location: [address], [place]  Evaluation Performed: Follow-up visit  PCP:  Pcp, No  Cardiologist:  James Hochrein, MD  Electrophysiologist:  Dr Ryla Cauthon  Chief Complaint:  atrial flutter  History of Present Illness:    Chad Hahn is a 77 y.o. male who presents via audio/video conferencing for a telehealth visit today.  Since last being seen in our clinic, the patient reports doing very well.  Today, he denies symptoms of palpitations, chest pain, shortness of breath,  lower extremity edema, dizziness, presyncope, or syncope.  The patient is otherwise without complaint today.   Today he feels well.  He has no chest pain or shortness of breath.  He continues to do all of his daily activities.  He is mildly fatigued.  Today, denies symptoms of palpitations, chest pain, shortness of breath, orthopnea, PND, lower extremity edema, claudication, dizziness, presyncope, syncope, bleeding, or neurologic sequela. The patient is tolerating medications without difficulties.    Past Medical History:  Diagnosis Date   BPH (benign prostatic hyperplasia)    CAD (coronary artery disease)    CABG age 37 and redo 1996, multiple PCIs as well   Chronic systolic CHF (congestive heart failure) (HCC)    DM2 (diabetes mellitus, type 2) (HCC)    Hypertension    ICD (implantable cardioverter-defibrillator) in place    St jude   Obesity    OSA on CPAP     Past Surgical History:  Procedure Laterality Date   BYPASS GRAFT ANGIOGRAPHY     CARDIOVERSION N/A 12/01/2020   Procedure: CARDIOVERSION;  Surgeon: Crenshaw, Brian S, MD;  Location: [facility];  Service: Cardiovascular;  Laterality: N/A;   CHOLECYSTECTOMY     PACEMAKER IMPLANT      Current Outpatient Medications  Medication Sig Dispense Refill   apixaban (ELIQUIS) 5 MG TABS tablet  TAKE 1 TABLET TWICE DAILY 180 tablet 1   carvedilol (COREG) 25 MG tablet Take 0.5 tablets (12.5 mg total) by mouth 2 (two) times daily with a meal.     clopidogrel (PLAVIX) 75 MG tablet Take 75 mg by mouth every morning.     ENTRESTO 49-51 MG TAKE 1 TABLET TWICE DAILY 180 tablet 3   ezetimibe (ZETIA) 10 MG tablet Take 1 tablet by mouth once daily 90 tablet 0   finasteride (PROSCAR) 5 MG tablet Take 5 mg by mouth every evening.     furosemide (LASIX) 20 MG tablet Take 1 tablet (20 mg total) by mouth daily. 30 tablet 0   JARDIANCE 10 MG TABS tablet TAKE 1 TABLET EVERY DAY BEFORE BREAKFAST 90 tablet 3   metFORMIN (GLUCOPHAGE-XR) 500 MG 24 hr tablet Take 500 mg by mouth 2 (two) times daily.     nitroGLYCERIN (NITROSTAT) 0.4 MG SL tablet Place 0.4 mg under the tongue every 5 (five) minutes x 3 doses as needed for chest pain.     rosuvastatin (CRESTOR) 40 MG tablet Take 40 mg by mouth at bedtime.     spironolactone (ALDACTONE) 25 MG tablet Take 0.5 tablets (12.5 mg total) by mouth daily. 90 tablet 3   No current facility-administered medications for this visit.    Allergies:   Patient has no known allergies.   Social History:  The patient  reports that he has never smoked. He has never used smokeless tobacco. He reports that   he does not currently use alcohol. He reports that he does not use drugs.   Family History:  The patient's  family history includes Heart disease in his mother and paternal uncle.   ROS:  Please see the history of present illness.   All other systems are personally reviewed and negative.    Exam:    Vital Signs:  There were no vitals taken for this visit.  Well appearing, alert and conversant, regular work of breathing,  good skin color Eyes- anicteric, neuro- grossly intact, skin- no apparent rash or lesions or cyanosis, mouth- oral mucosa is pink  Labs/Other Tests and Data Reviewed:    Recent Labs: 08/22/2022: BUN 27; Creatinine, Ser 1.21; Hemoglobin 13.6; Platelets  145; Potassium 4.7; Sodium 141   Wt Readings from Last 3 Encounters:  08/23/22 234 lb (106.1 kg)  08/23/22 234 lb (106.1 kg)  12/31/21 240 lb (108.9 kg)     Other studies personally reviewed: Additional studies/ records that were reviewed today include: ECG: atrial flutter   ASSESSMENT & PLAN:    1.  Atrial flutter: CHA2DS2-VASc of 5.  Currently on Eliquis.  Had been in atrial flutter for approximately 10 months.  Does not have clear symptoms.  Chad Hahn plan for cardioversion.  If he does not have a change in his symptoms, Chad Hahn likely plan for a rate control strategy.  2.  Coronary disease: Post CABG.  No current chest pain.  3.  Chronic systolic heart failure: Due to ischemic cardiomyopathy.  Post [facility] Jude CRT-D.  Device functioning appropriately.   Current medicines are reviewed at length with the patient today.   The patient does not have concerns regarding his medicines.  The following changes were made today:  none  Labs/ tests ordered today include:  No orders of the defined types were placed in this encounter.    Patient Risk:  after full review of this patients clinical status, I feel that they are at moderate risk at this time.     Signed, Tasheena Wambolt Martin Elyon Zoll, MD  09/23/2022 4:39 PM     [facility] [address]   [address] [phone] (office) [phone] (fax)  

## 2022-09-25 ENCOUNTER — Other Ambulatory Visit: Payer: Self-pay | Admitting: Cardiology

## 2022-09-25 DIAGNOSIS — E785 Hyperlipidemia, unspecified: Secondary | ICD-10-CM

## 2022-09-29 NOTE — Anesthesia Preprocedure Evaluation (Addendum)
Anesthesia Evaluation  Patient identified by MRN, date of birth, ID band Patient awake    Reviewed: Allergy & Precautions, H&P , NPO status , Patient's Chart, lab work & pertinent test results  Airway Mallampati: II  TM Distance: >3 FB Neck ROM: Full    Dental no notable dental hx. (+) Teeth Intact, Dental Advisory Given   Pulmonary sleep apnea and Continuous Positive Airway Pressure Ventilation    Pulmonary exam normal breath sounds clear to auscultation       Cardiovascular Exercise Tolerance: Good hypertension, Pt. on medications and Pt. on home beta blockers + CAD, + CABG and +CHF  + dysrhythmias Atrial Fibrillation  Rhythm:Irregular Rate:Normal     Neuro/Psych negative neurological ROS  negative psych ROS   GI/Hepatic negative GI ROS, Neg liver ROS,,,  Endo/Other  diabetes, Type 2, Oral Hypoglycemic Agents    Renal/GU negative Renal ROS  negative genitourinary   Musculoskeletal   Abdominal   Peds  Hematology negative hematology ROS (+)   Anesthesia Other Findings   Reproductive/Obstetrics negative OB ROS                             Anesthesia Physical Anesthesia Plan  ASA: 4  Anesthesia Plan: General   Post-op Pain Management: Minimal or no pain anticipated   Induction: Intravenous  PONV Risk Score and Plan: 2 and Propofol infusion and Treatment may vary due to age or medical condition  Airway Management Planned: Mask and Natural Airway  Additional Equipment:   Intra-op Plan:   Post-operative Plan:   Informed Consent: I have reviewed the patients History and Physical, chart, labs and discussed the procedure including the risks, benefits and alternatives for the proposed anesthesia with the patient or authorized representative who has indicated his/her understanding and acceptance.     Dental advisory given  Plan Discussed with: CRNA  Anesthesia Plan Comments:         Anesthesia Quick Evaluation

## 2022-09-29 NOTE — Progress Notes (Signed)
Spoke to pt and instructed them to be here at 0800 and to be NPO after 0000. Instructed pt to take [facility] in am and confirmed no missed doses. Instructed pt to have a ride home and responsible adult stay with them for 24 hours.

## 2022-09-30 ENCOUNTER — Encounter (HOSPITAL_COMMUNITY)
Admission: RE | Disposition: A | Discharge status: Home / Self Care | Payer: Self-pay | Source: Home / Self Care | Attending: Cardiovascular Disease

## 2022-09-30 ENCOUNTER — Ambulatory Visit (HOSPITAL_COMMUNITY): Payer: Medicare Other | Admitting: Anesthesiology

## 2022-09-30 ENCOUNTER — Ambulatory Visit (HOSPITAL_COMMUNITY)
Admission: RE | Admit: 2022-09-30 | Discharge: 2022-09-30 | Disposition: A | Discharge status: Home / Self Care | Payer: Medicare Other | Attending: Cardiovascular Disease | Admitting: Cardiovascular Disease

## 2022-09-30 ENCOUNTER — Other Ambulatory Visit: Payer: Self-pay

## 2022-09-30 ENCOUNTER — Encounter (HOSPITAL_COMMUNITY): Payer: Self-pay | Admitting: Cardiovascular Disease

## 2022-09-30 DIAGNOSIS — I509 Heart failure, unspecified: Secondary | ICD-10-CM | POA: Diagnosis not present

## 2022-09-30 DIAGNOSIS — I2510 Atherosclerotic heart disease of native coronary artery without angina pectoris: Secondary | ICD-10-CM

## 2022-09-30 DIAGNOSIS — I4892 Unspecified atrial flutter: Secondary | ICD-10-CM | POA: Diagnosis present

## 2022-09-30 DIAGNOSIS — I5022 Chronic systolic (congestive) heart failure: Secondary | ICD-10-CM | POA: Diagnosis not present

## 2022-09-30 DIAGNOSIS — E119 Type 2 diabetes mellitus without complications: Secondary | ICD-10-CM

## 2022-09-30 DIAGNOSIS — I255 Ischemic cardiomyopathy: Secondary | ICD-10-CM | POA: Insufficient documentation

## 2022-09-30 DIAGNOSIS — I110 Hypertensive heart disease with heart failure: Secondary | ICD-10-CM

## 2022-09-30 DIAGNOSIS — Z951 Presence of aortocoronary bypass graft: Secondary | ICD-10-CM

## 2022-09-30 DIAGNOSIS — Z7901 Long term (current) use of anticoagulants: Secondary | ICD-10-CM | POA: Diagnosis not present

## 2022-09-30 DIAGNOSIS — Z7984 Long term (current) use of oral hypoglycemic drugs: Secondary | ICD-10-CM

## 2022-09-30 DIAGNOSIS — Z95810 Presence of automatic (implantable) cardiac defibrillator: Secondary | ICD-10-CM | POA: Insufficient documentation

## 2022-09-30 HISTORY — PX: CARDIOVERSION: SHX1299

## 2022-09-30 LAB — POCT I-STAT, CHEM 8
BUN: 31 mg/dL — ABNORMAL HIGH (ref 8–23)
Calcium, Ion: 1.28 mmol/L (ref 1.15–1.40)
Chloride: 105 mmol/L (ref 98–111)
Creatinine, Ser: 1.4 mg/dL — ABNORMAL HIGH (ref 0.61–1.24)
Glucose, Bld: 161 mg/dL — ABNORMAL HIGH (ref 70–99)
HCT: 38 % — ABNORMAL LOW (ref 39.0–52.0)
Hemoglobin: 12.9 g/dL — ABNORMAL LOW (ref 13.0–17.0)
Potassium: 4.6 mmol/L (ref 3.5–5.1)
Sodium: 139 mmol/L (ref 135–145)
TCO2: 25 mmol/L (ref 22–32)

## 2022-09-30 SURGERY — CARDIOVERSION
Anesthesia: General

## 2022-09-30 MED ORDER — PROPOFOL 10 MG/ML IV BOLUS
INTRAVENOUS | Status: DC | PRN
  Administered 2022-09-30: 70 mg via INTRAVENOUS

## 2022-09-30 MED ORDER — LIDOCAINE 2% (20 MG/ML) 5 ML SYRINGE
INTRAMUSCULAR | Status: DC | PRN
  Administered 2022-09-30: 100 mg via INTRAVENOUS

## 2022-09-30 MED ORDER — SODIUM CHLORIDE 0.9 % IV SOLN: INTRAVENOUS | Status: DC

## 2022-09-30 SURGICAL SUPPLY — 1 items: ELECT DEFIB PAD ADLT CADENCE (PAD) ×1 IMPLANT

## 2022-09-30 NOTE — Interval H&P Note (Signed)
History and Physical Interval Note:  09/30/2022 8:14 AM  Chad Hahn  has presented today for surgery, with the diagnosis of AFLUTTER.  The various methods of treatment have been discussed with the patient and family. After consideration of risks, benefits and other options for treatment, the patient has consented to  Procedure(s): CARDIOVERSION (N/A) as a surgical intervention.  The patient's history has been reviewed, patient examined, no change in status, stable for surgery.  I have reviewed the patient's chart and labs.  Questions were answered to the patient's satisfaction.     Chilton Si, MD

## 2022-09-30 NOTE — Anesthesia Postprocedure Evaluation (Signed)
Anesthesia Post Note  Patient: Chad Hahn  Procedure(s) Performed: CARDIOVERSION     Patient location during evaluation: PACU Anesthesia Type: General Level of consciousness: awake and alert Pain management: pain level controlled Vital Signs Assessment: post-procedure vital signs reviewed and stable Respiratory status: spontaneous breathing, nonlabored ventilation, respiratory function stable and patient connected to nasal cannula oxygen Cardiovascular status: blood pressure returned to baseline and stable Postop Assessment: no apparent nausea or vomiting Anesthetic complications: no  No notable events documented.  Last Vitals:  Vitals:   09/30/22 0946 09/30/22 0949  BP: (!) 105/51 (!) 100/54  Pulse: 72 72  Resp: 14 15  Temp: 36.8 C 37.1 C  SpO2: 98% 99%    Last Pain:  Vitals:   09/30/22 0850  TempSrc:   PainSc: 0-No pain                 Maykayla Highley S

## 2022-09-30 NOTE — CV Procedure (Signed)
Electrical Cardioversion Procedure Note Chad Hahn [id number] 30-Apr-1945  Procedure: Electrical Cardioversion Indications:  Atrial Flutter  Procedure Details Consent: Risks of procedure as well as the alternatives and risks of each were explained to the (patient/caregiver).  Consent for procedure obtained. Time Out: Verified patient identification, verified procedure, site/side was marked, verified correct patient position, special equipment/implants available, medications/allergies/relevent history reviewed, required imaging and test results available.  Performed  Patient placed on cardiac monitor, pulse oximetry, supplemental oxygen as necessary.  Sedation given:  propofol Pacer pads placed anterior and posterior chest.  Cardioverted 1 time(s).  Cardioverted at 200J.  Evaluation Findings: Post procedure EKG shows:  APVP Complications: None Patient did tolerate procedure well.   Chilton Si, MD 09/30/2022, 9:16 AM

## 2022-09-30 NOTE — Transfer of Care (Signed)
Immediate Anesthesia Transfer of Care Note  Patient: Chad Hahn  Procedure(s) Performed: CARDIOVERSION  Patient Location: Cath Lab  Anesthesia Type:General  Level of Consciousness: drowsy and patient cooperative  Airway & Oxygen Therapy: Patient Spontanous Breathing and Patient connected to nasal cannula oxygen  Post-op Assessment: Report given to RN and Post -op Vital signs reviewed and stable  Post vital signs: Reviewed and stable  Last Vitals:  Vitals Value Taken Time  BP    Temp    Pulse 70 09/30/22 0903  Resp 15 09/30/22 0903  SpO2 96 % 09/30/22 0903  Vitals shown include unvalidated device data.  Last Pain:  Vitals:   09/30/22 0850  TempSrc:   PainSc: 0-No pain         Complications: No notable events documented.

## 2022-10-03 ENCOUNTER — Encounter (HOSPITAL_COMMUNITY): Payer: Self-pay | Admitting: Cardiovascular Disease

## 2022-12-02 ENCOUNTER — Other Ambulatory Visit (HOSPITAL_BASED_OUTPATIENT_CLINIC_OR_DEPARTMENT_OTHER): Payer: Self-pay | Admitting: Cardiology

## 2022-12-02 DIAGNOSIS — Z7901 Long term (current) use of anticoagulants: Secondary | ICD-10-CM

## 2022-12-02 DIAGNOSIS — I483 Typical atrial flutter: Secondary | ICD-10-CM

## 2022-12-02 NOTE — Telephone Encounter (Signed)
Prescription refill request for Eliquis received. Indication:aflutter Last office visit:5/24 Scr:1.4  6/24 Age: 77 Weight:104.3  kg  Prescription refilled

## 2023-01-03 ENCOUNTER — Ambulatory Visit: Payer: Medicare Other | Admitting: Cardiology

## 2023-01-16 ENCOUNTER — Encounter: Payer: Medicare Other | Admitting: Cardiology

## 2023-02-09 ENCOUNTER — Ambulatory Visit: Payer: Medicare Other | Admitting: Cardiology

## 2023-03-20 ENCOUNTER — Ambulatory Visit: Payer: Medicare Other | Admitting: Cardiology

## 2023-04-18 ENCOUNTER — Other Ambulatory Visit (HOSPITAL_BASED_OUTPATIENT_CLINIC_OR_DEPARTMENT_OTHER): Payer: Self-pay | Admitting: Cardiology

## 2023-04-18 DIAGNOSIS — I5022 Chronic systolic (congestive) heart failure: Secondary | ICD-10-CM

## 2023-04-28 ENCOUNTER — Telehealth: Payer: Self-pay

## 2023-04-28 NOTE — Telephone Encounter (Signed)
 Alert received from [employer] for persistent AF, controlled rates, Eliquis  per PA report. Last ov 09/23/22, asymptomatic, DCCV 6/7. AF persistent since mid Sept.  Route to triage, if rate control strategy recommend discontinuing AF alert.  Patient reports of thinking he was back in AF but unable to describe any specific symptoms. Patient is not back into town until a day or two prior to his next OV 05/16/23 with Jodie Passey, PA-C. Patient stressed importance to not miss any doses of Eliquis . Pt voiced understanding.

## 2023-05-11 IMAGING — CR DG CHEST 2V
2 series · 2 of 2 positions shown · non-contrast
Comparison: None.

CLINICAL DATA: Chest pain

EXAM:
CHEST - 2 VIEW

[chest pa]
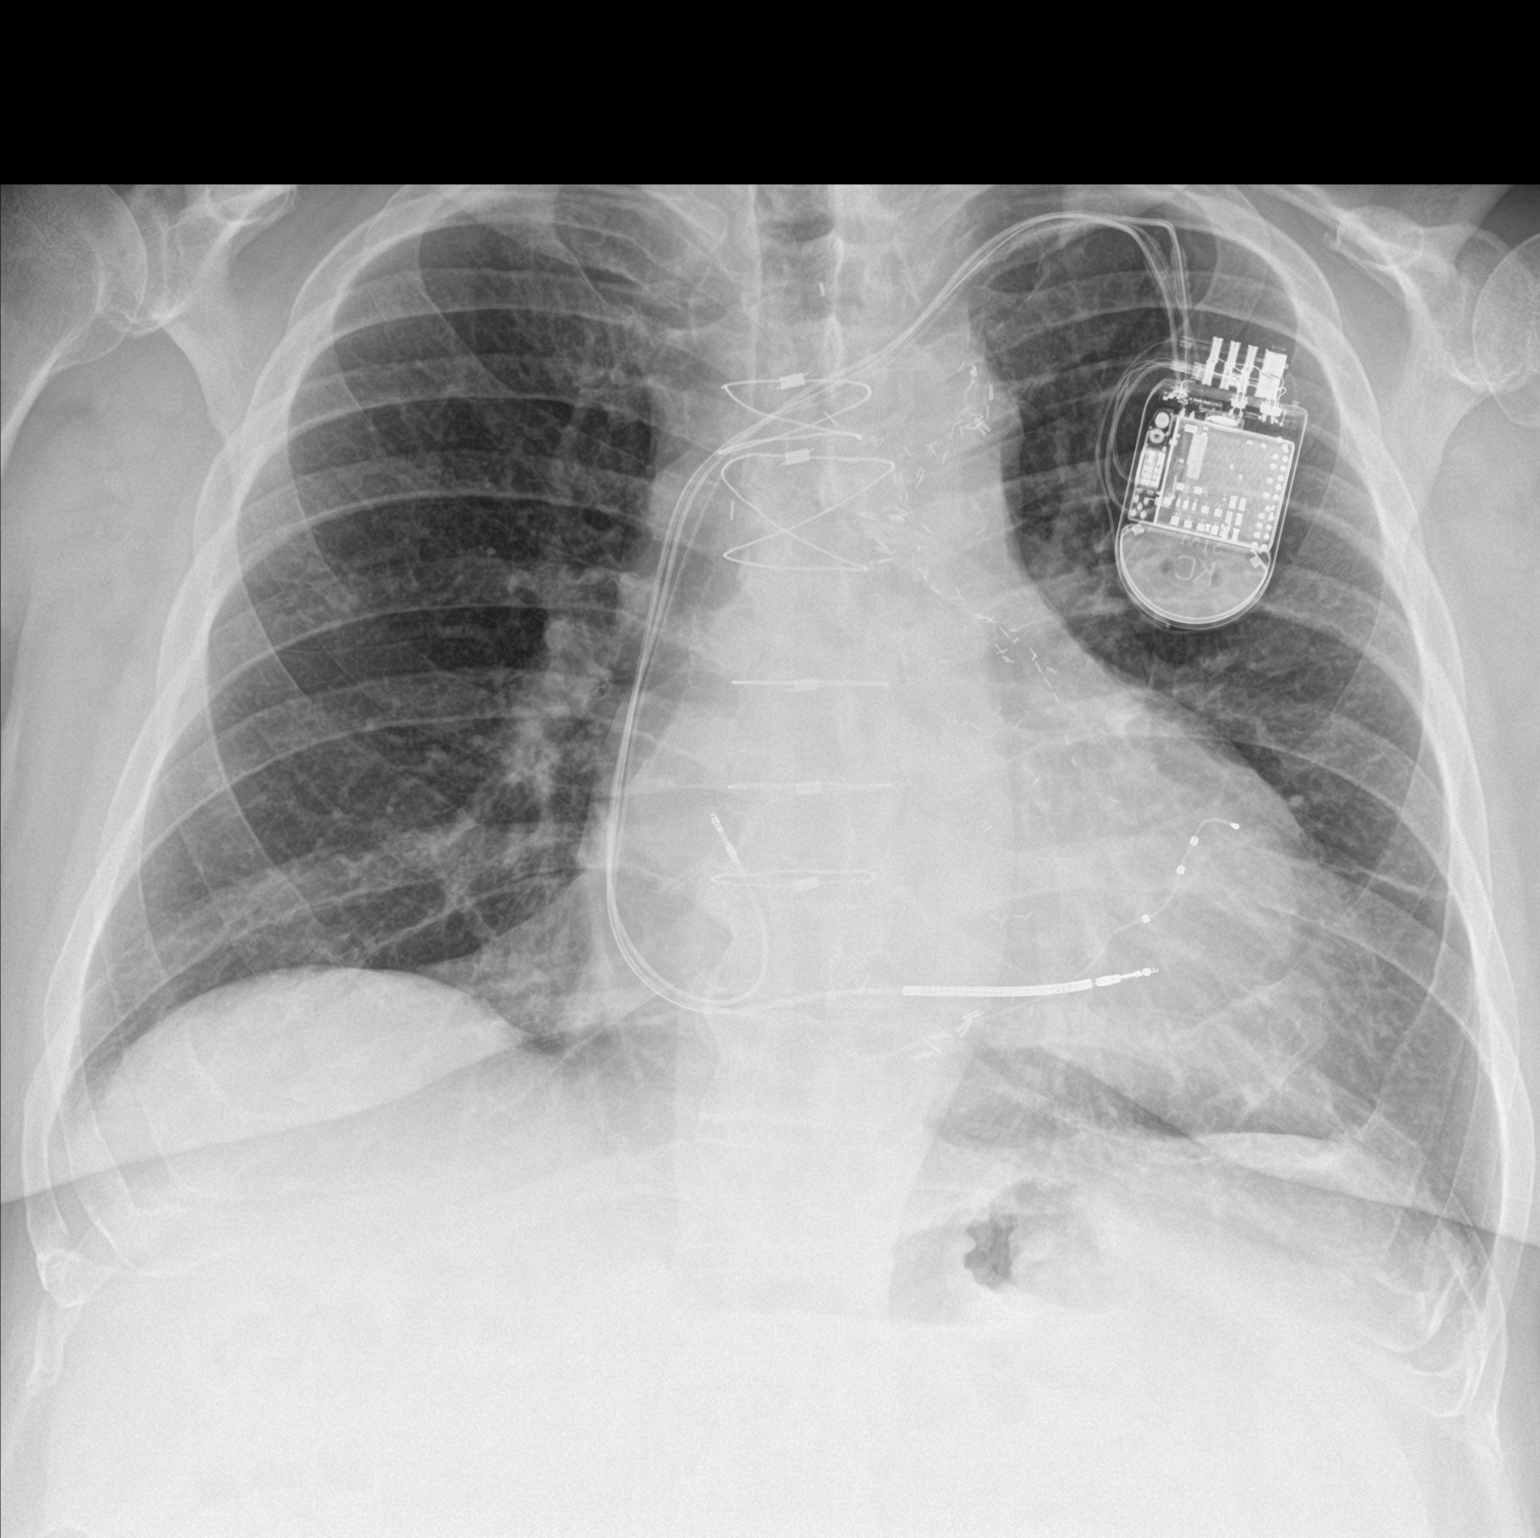

[chest lat]
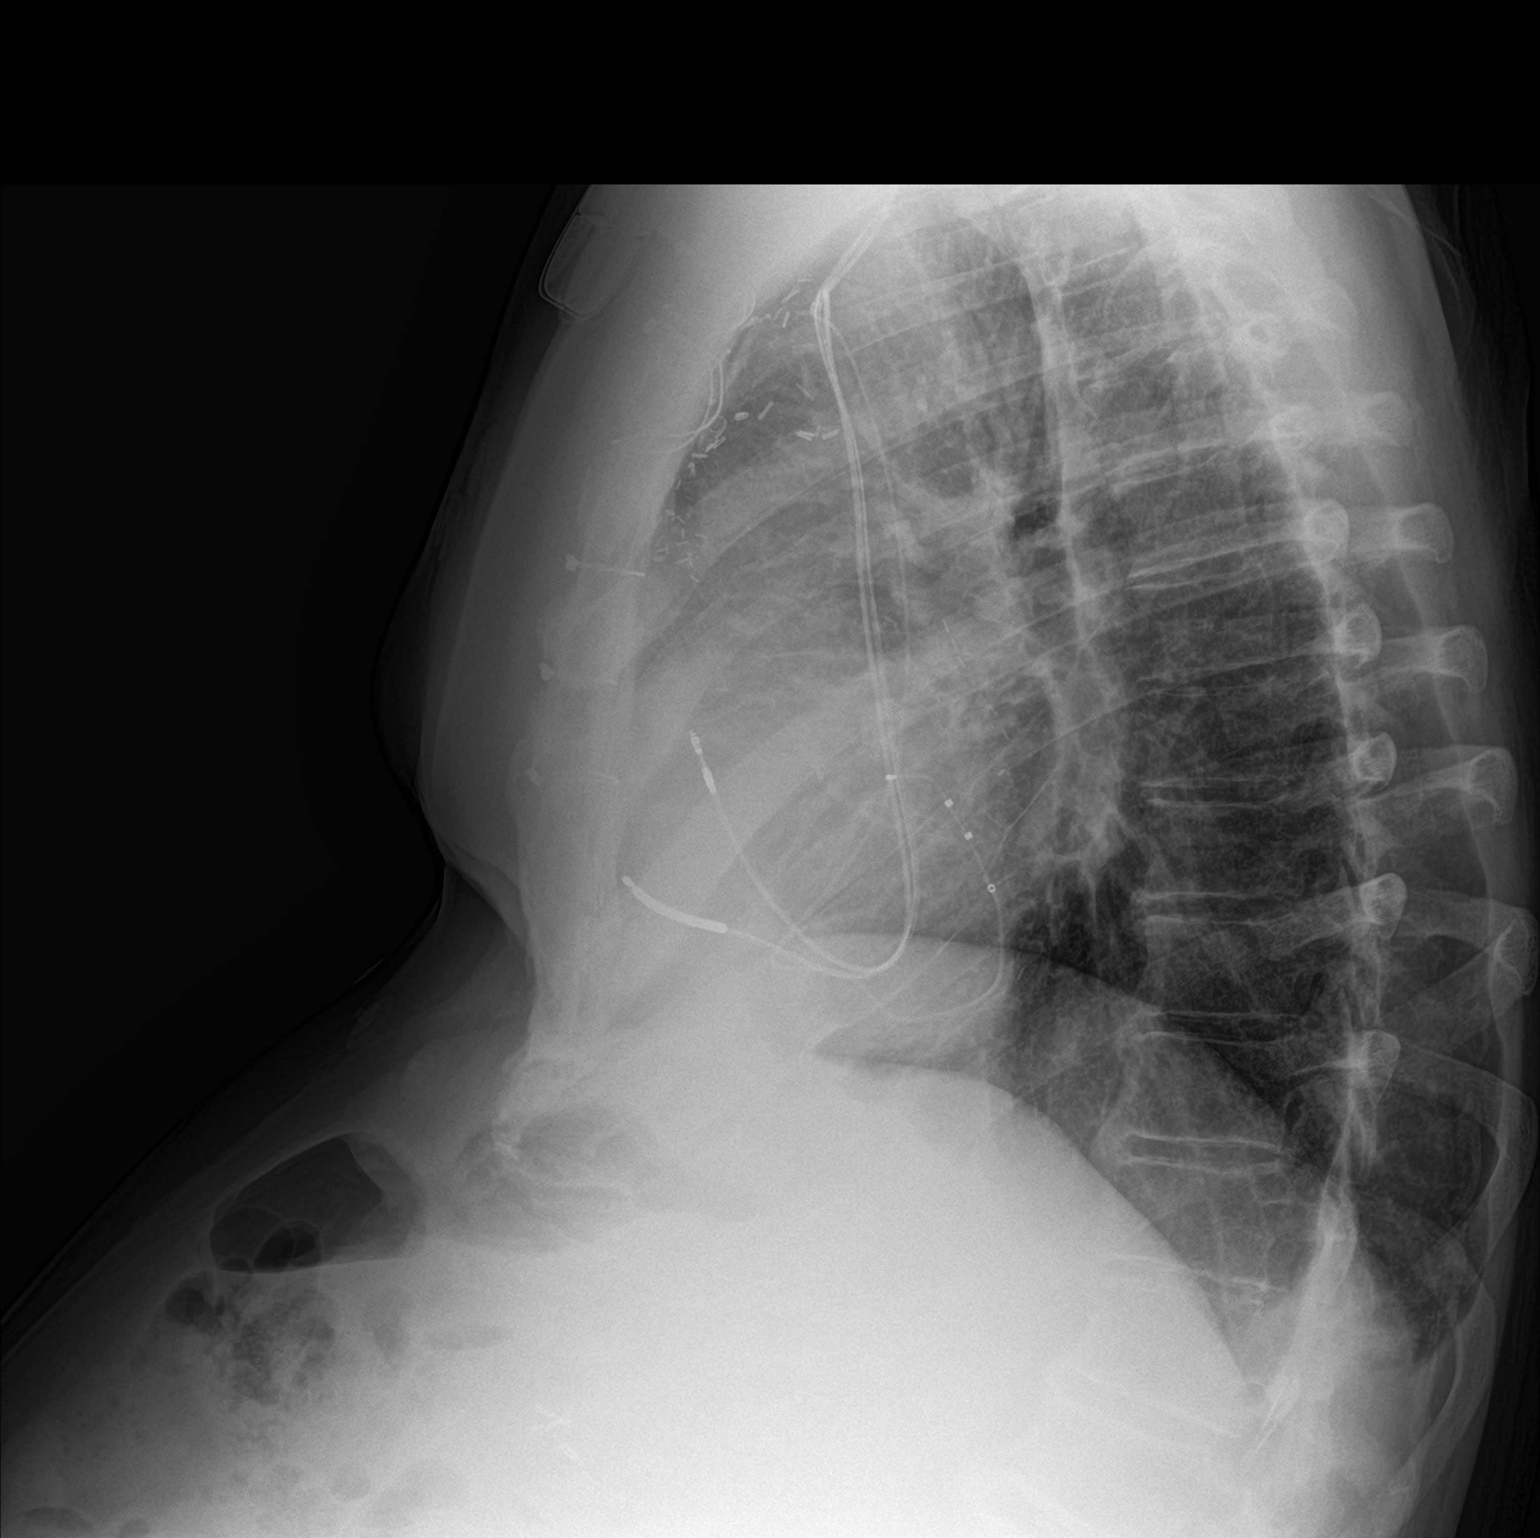

[2 of 2 positions shown; findings below may reference images not displayed]

FINDINGS: Left chest wall pacer device is noted with leads in the right atrial
appendage, coronary sinus and right ventricle. Status post median
sternotomy and CABG. Mild cardiac enlargement. No pleural effusion
or edema. The visualized skeletal structures are unremarkable.
IMPRESSION: No acute cardiopulmonary abnormalities.

Cardiac enlargement without signs of CHF.

## 2023-05-14 NOTE — Progress Notes (Unsigned)
Cardiology Office Note:   Date:  05/16/2023  ID:  Chad Hahn, DOB 05-27-1945, MRN [id number] PCP: Pcp, No   HeartCare Providers Cardiologist:  [name], MD Electrophysiologist:  Regan Lemming, MD {  History of Present Illness:   Chad Hahn is a 78 y.o. male  who presents for follow up of chronic systolic CHF, [employer] Jude CRT-D 2013, generator change out, coronary artery disease status post CABG with redo and PCI's, diabetes mellitus type 2, hypertension, obstructive sleep apnea on CPAP, BPH, and atrial flutter.   He is previously from the [facility].  He underwent CABG at age 33 and had a redo CABG in 1996.  He has had multiple repeat cardiac catheterizations with 2 stents in 2012.  His ICD was placed in 2013.  Patient reported EF is around 30%.  He was admitted to the hospital 09/2020 with worsening dyspnea.  He was found to be in atrial flutter.  On device interrogation he had been in atrial flutter since 3/22.  His echocardiogram 10/22/2020 showed an LVEF of 25-30%, akinesis and aneurysmal apex, mild LVH, dilated IVC.  He received IV diuresis.  His ACE inhibitor was transitioned to [place].  Jardiance was initiated as well as spironolactone.  He was started on apixaban at that time and plans for DCCV were discussed.  He was seen by transition of care  and was noted to have orthostatic hypotension.  His blood pressure was running in the 90s systolic.  His furosemide was transitioned from 60 mg to as needed dosing.  His potassium supplementation was discontinued at that time. He was seen by Gillian Shields, NP-C on 11/20/2020.  He underwent successful DCCV 12/01/2020 with 120 J x 1.  Is post EKG findings showed an AV paced rhythm.  He tolerated procedure well. He had recurrent flutter and had cardioversion in May 2024.    Since I last saw him he has been having lower blood pressures.  Systolics are sometimes in the 80s.  He is here with his daughter who is a cardiology nurse.  She  reports that he will get lightheaded and have to hold onto things at times.  He still works full-time and is around [employer] site.  He is climbing up stairs.  He is walking through debris.  He has not had any frank syncope but he does have orthostatic symptoms.  He is not having any new shortness of breath, PND or orthopnea.  He has had no new palpitations, presyncope or syncope.  ROS: As stated in the HPI and negative for all other systems.  Studies Reviewed:    EKG:   NA  Risk Assessment/Calculations:    CHA2DS2-VASc Score =     This indicates a  % annual risk of stroke. The patient's score is based upon: HTN History: 0 Diabetes History: 1 Stroke History: 0 Vascular Disease History: 1 Age Score: 2 Gender Score: 0              Physical Exam:   VS:  BP (!) 82/70 (BP Location: Left Arm, Patient Position: Sitting, Cuff Size: Normal)   Pulse 69   Ht 5\' 10"  (1.778 m)   Wt 251 lb 12.8 oz (114.2 kg)   SpO2 96%   BMI 36.13 kg/m    Wt Readings from Last 3 Encounters:  05/16/23 251 lb 12.8 oz (114.2 kg)  05/16/23 253 lb 12.8 oz (115.1 kg)  09/30/22 230 lb (104.3 kg)     GEN: Well nourished,  well developed in no acute distress NECK: No JVD; No carotid bruits CARDIAC: RRR, no murmurs, rubs, gallops RESPIRATORY:  Clear to auscultation without rales, wheezing or rhonchi  ABDOMEN: Soft, non-tender, non-distended EXTREMITIES:  No edema; No deformity   ASSESSMENT AND PLAN:   Atrial flutter:  He had cardioversion in May 2024.   Looks like his device demonstrates some paroxysmal fibrillation but he does not really feel this.  He is tolerating anticoagulation.  No change in therapy.  EKG this morning at the device clinic was normal sinus.    HFrEF: His blood pressure demands that I reduce his Entresto to 24/26 twice daily.  We will leave the other meds as listed.   Coronary artery disease :   I am continuing with dual antiplatelet therapy.  He has no anginal symptoms.   He will continue with risk reduction.   CRT-D :   He is up-to-date with follow-up and I looked at his device interrogation.  His impedance was okay.  He has appropriate device function.   DM:   A1c was 7.3 and I will be repeating this.  He is going to see his endocrinologist about a GLP-1.   DYSLIPIDEMIA: LDL was previously at target.  I will give him written instructions for lipid profile and LP(a).  At target.  No change in therapy.      Follow up with me in six month  Signed, [name], MD

## 2023-05-15 NOTE — Progress Notes (Unsigned)
  Electrophysiology Office Note:   ID:  Chad Hahn, Chad Hahn [birth year], MRN [id number]  Primary Cardiologist: [name], MD Electrophysiologist: Will Jorja Loa, MD  {Click to update primary MD,subspecialty MD or APP then REFRESH:1}    History of Present Illness:   Chad Hahn is a 78 y.o. male with h/o CHF, CAD s/p CABG, DM2, HTN, OSA on CPAP, and AFL seen today for routine electrophysiology followup.   Since last being seen in our clinic the patient reports doing ***.  he denies chest pain, palpitations, dyspnea, PND, orthopnea, nausea, vomiting, dizziness, syncope, edema, weight gain, or early satiety.   Review of systems complete and found to be negative unless listed in HPI.   EP Information / Studies Reviewed:    EKG is ordered today. Personal review as below.       ICD Interrogation-  reviewed in detail today,  See PACEART report.  Device History: Abbott BiV ICD implanted 2013, gen change 2020 for CHF  Physical Exam:   VS:  There were no vitals taken for this visit.   Wt Readings from Last 3 Encounters:  09/30/22 230 lb (104.3 kg)  08/23/22 234 lb (106.1 kg)  08/23/22 234 lb (106.1 kg)     GEN: No acute distress *** NECK: No JVD; No carotid bruits CARDIAC: {EPRHYTHM:28826}, no murmurs, rubs, gallops RESPIRATORY:  Clear to auscultation without rales, wheezing or rhonchi  ABDOMEN: Soft, non-tender, non-distended EXTREMITIES:  {EDEMA LEVEL:28147::"No"} edema; No deformity   ASSESSMENT AND PLAN:    Chronic systolic CHF  s/p Abbott CRT-D  euvolemic today Stable on an appropriate medical regimen Normal ICD function See Pace Art report No changes today  CAD s/p CABG Denies s/s ischemia  Atrial flutter EKG today shows *** Continue Eliquis 5 mg BID for CHA2DS2VASc of at least 6.  Disposition:   Follow up with {EPPROVIDERS:28135} {EPFOLLOW UP:28173}   Signed, Graciella Freer, PA-C

## 2023-05-16 ENCOUNTER — Ambulatory Visit: Payer: Medicare Other | Attending: Cardiology | Admitting: Cardiology

## 2023-05-16 ENCOUNTER — Other Ambulatory Visit: Payer: Self-pay

## 2023-05-16 ENCOUNTER — Ambulatory Visit (INDEPENDENT_AMBULATORY_CARE_PROVIDER_SITE_OTHER): Payer: Medicare Other | Admitting: Student

## 2023-05-16 ENCOUNTER — Encounter: Payer: Self-pay | Admitting: Student

## 2023-05-16 ENCOUNTER — Encounter: Payer: Self-pay | Admitting: Cardiology

## 2023-05-16 VITALS — BP 80/42 | HR 70 | Ht 70.0 in | Wt 253.8 lb

## 2023-05-16 VITALS — BP 82/70 | HR 69 | Ht 70.0 in | Wt 251.8 lb

## 2023-05-16 DIAGNOSIS — Z7901 Long term (current) use of anticoagulants: Secondary | ICD-10-CM | POA: Insufficient documentation

## 2023-05-16 DIAGNOSIS — I5022 Chronic systolic (congestive) heart failure: Secondary | ICD-10-CM

## 2023-05-16 DIAGNOSIS — E118 Type 2 diabetes mellitus with unspecified complications: Secondary | ICD-10-CM | POA: Insufficient documentation

## 2023-05-16 DIAGNOSIS — I483 Typical atrial flutter: Secondary | ICD-10-CM | POA: Diagnosis present

## 2023-05-16 DIAGNOSIS — I4892 Unspecified atrial flutter: Secondary | ICD-10-CM | POA: Diagnosis not present

## 2023-05-16 DIAGNOSIS — I2510 Atherosclerotic heart disease of native coronary artery without angina pectoris: Secondary | ICD-10-CM | POA: Insufficient documentation

## 2023-05-16 DIAGNOSIS — I420 Dilated cardiomyopathy: Secondary | ICD-10-CM | POA: Insufficient documentation

## 2023-05-16 DIAGNOSIS — I255 Ischemic cardiomyopathy: Secondary | ICD-10-CM | POA: Insufficient documentation

## 2023-05-16 DIAGNOSIS — I5040 Unspecified combined systolic (congestive) and diastolic (congestive) heart failure: Secondary | ICD-10-CM

## 2023-05-16 DIAGNOSIS — E785 Hyperlipidemia, unspecified: Secondary | ICD-10-CM | POA: Diagnosis present

## 2023-05-16 LAB — CUP PACEART INCLINIC DEVICE CHECK
Battery Remaining Longevity: 20 mo
Brady Statistic RA Percent Paced: 32 %
Brady Statistic RV Percent Paced: 99.43 %
Date Time Interrogation Session: 20250121102814
HighPow Impedance: 76.5 Ohm
Implantable Lead Connection Status: 753985
Implantable Lead Connection Status: 753985
Implantable Lead Connection Status: 753985
Implantable Lead Implant Date: 20131001
Implantable Lead Implant Date: 20131001
Implantable Lead Implant Date: 20131001
Implantable Lead Location: 753858
Implantable Lead Location: 753859
Implantable Lead Location: 753860
Implantable Pulse Generator Implant Date: 20200922
Lead Channel Impedance Value: 350 Ohm
Lead Channel Impedance Value: 450 Ohm
Lead Channel Impedance Value: 950 Ohm
Lead Channel Pacing Threshold Amplitude: 0.625 V
Lead Channel Pacing Threshold Amplitude: 0.625 V
Lead Channel Pacing Threshold Amplitude: 1.625 V
Lead Channel Pacing Threshold Pulse Width: 0.5 ms
Lead Channel Pacing Threshold Pulse Width: 0.5 ms
Lead Channel Pacing Threshold Pulse Width: 1.5 ms
Lead Channel Sensing Intrinsic Amplitude: 11.7 mV
Lead Channel Sensing Intrinsic Amplitude: 3.1 mV
Lead Channel Setting Pacing Amplitude: 2 V
Lead Channel Setting Pacing Amplitude: 2 V
Lead Channel Setting Pacing Amplitude: 2.625
Lead Channel Setting Pacing Pulse Width: 0.5 ms
Lead Channel Setting Pacing Pulse Width: 1.5 ms
Lead Channel Setting Sensing Sensitivity: 0.5 mV
Pulse Gen Serial Number: 9911657
Zone Setting Status: 755011

## 2023-05-16 LAB — CBC

## 2023-05-16 MED ORDER — FUROSEMIDE 20 MG PO TABS: 20.0000 mg | ORAL_TABLET | Freq: Every day | ORAL | 3 refills | Status: DC

## 2023-05-16 MED ORDER — SACUBITRIL-VALSARTAN 24-26 MG PO TABS: 1.0000 | ORAL_TABLET | Freq: Two times a day (BID) | ORAL | 3 refills | Status: AC

## 2023-05-16 MED ORDER — CLOPIDOGREL BISULFATE 75 MG PO TABS: 75.0000 mg | ORAL_TABLET | Freq: Every morning | ORAL | 3 refills | Status: DC

## 2023-05-16 MED ORDER — EZETIMIBE 10 MG PO TABS: 10.0000 mg | ORAL_TABLET | Freq: Every day | ORAL | 3 refills | Status: AC

## 2023-05-16 MED ORDER — SPIRONOLACTONE 25 MG PO TABS: 25.0000 mg | ORAL_TABLET | Freq: Every day | ORAL | 3 refills | Status: AC

## 2023-05-16 MED ORDER — ROSUVASTATIN CALCIUM 40 MG PO TABS: 40.0000 mg | ORAL_TABLET | Freq: Every evening | ORAL | 3 refills | Status: AC

## 2023-05-16 MED ORDER — NITROGLYCERIN 0.4 MG SL SUBL: 0.4000 mg | SUBLINGUAL_TABLET | SUBLINGUAL | 11 refills | Status: AC | PRN

## 2023-05-16 MED ORDER — APIXABAN 5 MG PO TABS: 5.0000 mg | ORAL_TABLET | Freq: Two times a day (BID) | ORAL | 3 refills | Status: AC

## 2023-05-16 MED ORDER — FUROSEMIDE 20 MG PO TABS: 20.0000 mg | ORAL_TABLET | Freq: Every day | ORAL | 3 refills | Status: AC

## 2023-05-16 MED ORDER — EMPAGLIFLOZIN 10 MG PO TABS: 10.0000 mg | ORAL_TABLET | Freq: Every day | ORAL | 3 refills | Status: AC

## 2023-05-16 MED ORDER — CARVEDILOL 25 MG PO TABS: 12.5000 mg | ORAL_TABLET | Freq: Two times a day (BID) | ORAL | Status: AC

## 2023-05-16 NOTE — Patient Instructions (Signed)
Medication Instructions:  Your physician recommends that you continue on your current medications as directed. Please refer to the Current Medication list given to you today.  *If you need a refill on your cardiac medications before your next appointment, please call your pharmacy*  Lab Work: BMET, CBC-TODAY If you have labs (blood work) drawn today and your tests are completely normal, you will receive your results only by: MyChart Message (if you have MyChart) OR A paper copy in the mail If you have any lab test that is abnormal or we need to change your treatment, we will call you to review the results.  Follow-Up: At [facility], you and your health needs are our priority.  As part of our continuing mission to provide you with exceptional heart care, we have created designated Provider Care Teams.  These Care Teams include your primary Cardiologist (physician) and Advanced Practice Providers (APPs -  Physician Assistants and Nurse Practitioners) who all work together to provide you with the care you need, when you need it.  Your next appointment:   6 month(s)  Provider:   Loman Brooklyn, MD or [facility]" [place], [place]

## 2023-05-16 NOTE — Addendum Note (Signed)
Addended by: Jeannette How A on: 05/16/2023 02:55 PM   Modules accepted: Orders

## 2023-05-16 NOTE — Patient Instructions (Signed)
Medication Instructions:  Sacubitril-valsartan reduced to 26-26 by mouth twice per day. Script sent to [facility]. Other scripts to be sent as requested *If you need a refill on your cardiac medications before your next appointment, please call your pharmacy*  Lab Work: Fasting Lipid profile, Lpa, and HgA1C to be done at earliest convenience.  If you have labs (blood work) drawn today and your tests are completely normal, you will receive your results only by: MyChart Message (if you have MyChart) OR A paper copy in the mail If you have any lab test that is abnormal or we need to change your treatment, we will call you to review the results.  Follow-Up: At [facility], you and your health needs are our priority.  As part of our continuing mission to provide you with exceptional heart care, we have created designated Provider Care Teams.  These Care Teams include your primary Cardiologist (physician) and Advanced Practice Providers (APPs -  Physician Assistants and Nurse Practitioners) who all work together to provide you with the care you need, when you need it.  We recommend signing up for the patient portal called "MyChart".  Sign up information is provided on this After Visit Summary.  MyChart is used to connect with patients for Virtual Visits (Telemedicine).  Patients are able to view lab/test results, encounter notes, upcoming appointments, etc.  Non-urgent messages can be sent to your provider as well.   To learn more about what you can do with MyChart, go to [email or web address].    Your next appointment:   6 month(s)  Provider:   [name], MD

## 2023-05-17 ENCOUNTER — Other Ambulatory Visit: Payer: Self-pay | Admitting: *Deleted

## 2023-05-17 DIAGNOSIS — Z79899 Other long term (current) drug therapy: Secondary | ICD-10-CM

## 2023-05-17 LAB — BASIC METABOLIC PANEL
BUN/Creatinine Ratio: 20 (ref 10–24)
BUN: 31 mg/dL — ABNORMAL HIGH (ref 8–27)
CO2: 23 mmol/L (ref 20–29)
Calcium: 9.4 mg/dL (ref 8.6–10.2)
Chloride: 100 mmol/L (ref 96–106)
Creatinine, Ser: 1.54 mg/dL — ABNORMAL HIGH (ref 0.76–1.27)
Glucose: 233 mg/dL — ABNORMAL HIGH (ref 70–99)
Potassium: 5.6 mmol/L — ABNORMAL HIGH (ref 3.5–5.2)
Sodium: 135 mmol/L (ref 134–144)
eGFR: 46 mL/min/{1.73_m2} — ABNORMAL LOW (ref >59)

## 2023-05-17 LAB — CBC
Hematocrit: 45 % (ref 37.5–51.0)
Hemoglobin: 14.4 g/dL (ref 13.0–17.7)
MCH: 29.5 pg (ref 26.6–33.0)
MCHC: 32 g/dL (ref 31.5–35.7)
MCV: 92 fL (ref 79–97)
Platelets: 154 10*3/uL (ref 150–450)
RBC: 4.88 x10E6/uL (ref 4.14–5.80)
RDW: 14 % (ref 11.6–15.4)
WBC: 5.6 10*3/uL (ref 3.4–10.8)

## 2023-07-16 LAB — HEMOGLOBIN A1C
Est. average glucose Bld gHb Est-mCnc: 209 mg/dL
Hgb A1c MFr Bld: 8.9 % — ABNORMAL HIGH (ref 4.8–5.6)

## 2023-07-16 LAB — LIPID PANEL
Chol/HDL Ratio: 2.7 ratio (ref 0.0–5.0)
Cholesterol, Total: 97 mg/dL — ABNORMAL LOW (ref 100–199)
HDL: 36 mg/dL — ABNORMAL LOW (ref >39)
LDL Chol Calc (NIH): 38 mg/dL (ref 0–99)
Triglycerides: 132 mg/dL (ref 0–149)
VLDL Cholesterol Cal: 23 mg/dL (ref 5–40)

## 2023-07-16 LAB — LIPOPROTEIN A (LPA): Lipoprotein (a): 61.7 nmol/L (ref <75.0)

## 2023-08-18 ENCOUNTER — Other Ambulatory Visit: Payer: Self-pay

## 2023-08-18 MED ORDER — CLOPIDOGREL BISULFATE 75 MG PO TABS: 75.0000 mg | ORAL_TABLET | Freq: Every morning | ORAL | 2 refills | Status: AC

## 2023-09-27 ENCOUNTER — Encounter: Payer: Self-pay | Admitting: Cardiology
